# Patient Record
Sex: Female | Born: 1975
Health system: Southern US, Community
[De-identification: ages and names within clinical notes are randomized; demographics above are authoritative.]

## PROBLEM LIST (undated history)

## (undated) DIAGNOSIS — I10 Essential (primary) hypertension: Secondary | ICD-10-CM

## (undated) DIAGNOSIS — I8390 Asymptomatic varicose veins of unspecified lower extremity: Secondary | ICD-10-CM

## (undated) DIAGNOSIS — R55 Syncope and collapse: Secondary | ICD-10-CM

## (undated) HISTORY — DX: Essential (primary) hypertension: I10

## (undated) HISTORY — DX: Syncope and collapse: R55

## (undated) HISTORY — DX: Asymptomatic varicose veins of unspecified lower extremity: I83.90

---

## 2004-09-29 ENCOUNTER — Emergency Department (HOSPITAL_COMMUNITY): Admission: EM | Admit: 2004-09-29 | Discharge: 2004-09-29 | Payer: Self-pay | Admitting: Emergency Medicine

## 2005-09-03 ENCOUNTER — Inpatient Hospital Stay (HOSPITAL_COMMUNITY): Admission: AD | Admit: 2005-09-03 | Discharge: 2005-09-07 | Payer: Self-pay | Admitting: Obstetrics and Gynecology

## 2006-09-11 ENCOUNTER — Encounter (INDEPENDENT_AMBULATORY_CARE_PROVIDER_SITE_OTHER): Payer: Self-pay | Admitting: *Deleted

## 2006-09-11 ENCOUNTER — Ambulatory Visit: Payer: Self-pay | Admitting: Internal Medicine

## 2010-10-22 ENCOUNTER — Emergency Department (HOSPITAL_COMMUNITY): Admission: EM | Admit: 2010-10-22 | Discharge: 2010-10-22 | Payer: Self-pay | Admitting: Emergency Medicine

## 2011-05-17 NOTE — H&P (Signed)
Jennifer Wyatt               ACCOUNT NO.:  [id number]   MEDICAL RECORD NO.:  [id number]          PATIENT TYPE:  INP   LOCATION:  LDR2                          FACILITY:  [facility]   PHYSICIAN:  Tilda Burrow, M.D. DATE OF BIRTH:  May 30, 1976   DATE OF ADMISSION:  09/03/2005  DATE OF DISCHARGE:  LH                                HISTORY & PHYSICAL   Jennifer Wyatt's blood pressure on bedrest remains in the 150s/160s over high 90s.  Her protein on dip remained about 2+. Her 24-hour, however, was only 204 mg  per 24-hour period. Her swelling increased, so we just decided it would be  in her best interests to go ahead and deliver her.   PAST MEDICAL HISTORY:  She began prenatal care in her first trimester and  has had regular visits since then. Her prenatal labs include blood type O  positive, rubella immune. HBSAG, HIV, RPR, gonorrhea, chlamydia were all  negative. She was seropositive for HSV with no history of outbreaks. She has  been on suppression since August 08, 2005. Total weight gain has been about  45 pounds. She has had appropriate fundal height growth. Blood pressures  initially were 110s to 130s over 60s to 80s. The past few weeks, she has  slowly been creeping up until September 03, 2005 where she did have the  150/94 and started spilling 2+ protein where in the past she had been  negative to trace. DTRs about 2+. She does have 2 to 3+ pedal edema. Her  labs were all within normal limits.   PHYSICAL EXAMINATION:  HEENT:  Within normal limits.  HEART:  Regular rate and rhythm.  LUNGS:  Clear.  ABDOMEN:  Soft and nontender. No epigastric pain. Fundal height about 38 cm.  Estimated fetal weight around 7 to 8 pounds.  PELVIC:  Cervix was 1 cm, thick, -2 station. Vertex presentation.  EXTREMITIES:  Legs:  2 to 3+ edema. DTRs are 2+.   IMPRESSION:  Intrauterine pregnancy at 37 weeks, 5 days gestation. Pregnancy-  induced hypertension with proteinuria.   PLAN:  Dr. Emelda Fear will  place Cervidil to ripen the cervix and will plan  Pitocin and artificial rupture of membranes.     Jennifer Wyatt, C.N.M.      Tilda Burrow, M.D.  Electronically Signed   FC/MEDQ  D:  09/05/2005  T:  09/05/2005  Job:  [id number]

## 2011-05-17 NOTE — Op Note (Signed)
NAMESILVANNA, OHMER NO.:  [id number]   MEDICAL RECORD NO.:  [id number]          PATIENT TYPE:  INP   LOCATION:  LDR2                          FACILITY:  [facility]   PHYSICIAN:  Tilda Burrow, M.D. DATE OF BIRTH:  May 02, 1976   DATE OF PROCEDURE:  09/05/2005  DATE OF DISCHARGE:                                 OPERATIVE REPORT   DELIVERY NOTE:  Jareli sat up, got her epidural just enough to get a test  dose before she felt lots of pressure. She was laid back down, and the baby  was a +3 station. She was having some bradycardia. It came down to the 60s  which would return to baseline in between contractions. She did have O2  going. After about four contractions, she had a spontaneous vaginal delivery  of a viable female infant at 52. It delivered in a right occiput posterior  direction. There was no nuchal cord identified. Delivery was at 1447. The  mouth and nose were suctioned. Cord doubly clamped and cut. Weight is  pending though it looks to be around 7 pounds. Very little vernix and  peeling feet. Apgars are 8 and 9. Twenty units of Pitocin diluted in 1,000  cc of lactated ringers was infused rapidly IV. The placenta separated  spontaneously and was delivered via controlled cord traction at 1449. It was  inspected and appears to be intact with a three-vessel cord. Blood pressures  remained in the 150s to 160s over 80 region. Estimated blood loss 150 cc.  There was no catheter from the epidural to be removed.      Jacklyn Shell, C.N.M.      Tilda Burrow, M.D.  Electronically Signed    FC/MEDQ  D:  09/05/2005  T:  09/05/2005  Job:  [id number]

## 2011-05-17 NOTE — Group Therapy Note (Signed)
[name], [name]               ACCOUNT NO.:  [id number]   MEDICAL RECORD NO.:  [id number]          PATIENT TYPE:  OIB   LOCATION:  A414                          FACILITY:  [facility]   PHYSICIAN:  Lazaro Arms, M.D.   DATE OF BIRTH:  1976/06/05   DATE OF PROCEDURE:  DATE OF DISCHARGE:                                   PROGRESS NOTE   Jennifer Wyatt is 37 weeks 3 days gestation, and just came today for a regular  prenatal visit.  Her blood pressure was elevated to 150/94, with 2+  proteinuria.  She has had some slightly elevated blood pressure a few weeks  ago at 136/90, where she typically runs 110s-130/60s-80s.  She was admitted  for observation for evaluation and to rule out preeclampsia.  We are going  to start a 24-hour urine on her, keep an eye on her blood pressures, and  take it from there.      Jacklyn Shell, C.N.M.      Lazaro Arms, M.D.  Electronically Signed    FC/MEDQ  D:  09/03/2005  T:  09/03/2005  Job:  [id number]   cc:   [facility]

## 2011-05-17 NOTE — Op Note (Signed)
[name], Jennifer Wyatt             ACCOUNT NO.:  [id number]   MEDICAL RECORD NO.:  [id number]          PATIENT TYPE:  INP   LOCATION:  A413                          FACILITY:  [facility]   PHYSICIAN:  Tilda Burrow, M.D. DATE OF BIRTH:  Oct 22, 1976   DATE OF PROCEDURE:  09/05/2005  DATE OF DISCHARGE:  09/07/2005                                 OPERATIVE REPORT   EPIDURAL CATHETER NOTE:  Continuous lumbar epidural placed using loss-of-  resistance technique at L3-4 interspace with successful placement of the  catheter into the epidural space. We gave a 5-cc test dose of 1.5% lidocaine  with epinephrine followed by a 9-cc test dose of Marcaine 0.125% solution.  She was then placed on infusion on 12 cc per hour with good analgesic,  symmetric analgesic effect.      Tilda Burrow, M.D.  Electronically Signed     JVF/MEDQ  D:  09/19/2005  T:  09/19/2005  Job:  [id number]

## 2012-05-21 ENCOUNTER — Other Ambulatory Visit (HOSPITAL_COMMUNITY)
Admission: RE | Admit: 2012-05-21 | Discharge: 2012-05-21 | Disposition: A | Discharge status: Home / Self Care | Payer: Self-pay | Source: Ambulatory Visit | Attending: Obstetrics and Gynecology | Admitting: Obstetrics and Gynecology

## 2012-05-21 DIAGNOSIS — R87810 Cervical high risk human papillomavirus (HPV) DNA test positive: Secondary | ICD-10-CM | POA: Insufficient documentation

## 2012-05-21 DIAGNOSIS — Z113 Encounter for screening for infections with a predominantly sexual mode of transmission: Secondary | ICD-10-CM | POA: Insufficient documentation

## 2012-05-21 DIAGNOSIS — Z01419 Encounter for gynecological examination (general) (routine) without abnormal findings: Secondary | ICD-10-CM | POA: Insufficient documentation

## 2013-03-19 ENCOUNTER — Ambulatory Visit (INDEPENDENT_AMBULATORY_CARE_PROVIDER_SITE_OTHER): Payer: 59 | Admitting: Adult Health

## 2013-03-19 VITALS — BP 120/80 | Ht 64.0 in | Wt 172.0 lb

## 2013-03-19 DIAGNOSIS — Z3049 Encounter for surveillance of other contraceptives: Secondary | ICD-10-CM

## 2013-03-19 DIAGNOSIS — Z3202 Encounter for pregnancy test, result negative: Secondary | ICD-10-CM

## 2013-03-19 DIAGNOSIS — Z309 Encounter for contraceptive management, unspecified: Secondary | ICD-10-CM

## 2013-03-19 LAB — POCT URINE PREGNANCY: Preg Test, Ur: NEGATIVE

## 2013-03-19 MED ORDER — MEDROXYPROGESTERONE ACETATE 150 MG/ML IM SUSP
150.0000 mg | Freq: Once | INTRAMUSCULAR | Status: AC
  Administered 2013-03-19: 150 mg via INTRAMUSCULAR

## 2013-06-11 ENCOUNTER — Encounter: Payer: Self-pay | Admitting: Adult Health

## 2013-06-11 ENCOUNTER — Ambulatory Visit (INDEPENDENT_AMBULATORY_CARE_PROVIDER_SITE_OTHER): Payer: 59 | Admitting: Adult Health

## 2013-06-11 VITALS — BP 134/94 | Ht 64.0 in | Wt 177.0 lb

## 2013-06-11 DIAGNOSIS — Z3200 Encounter for pregnancy test, result unknown: Secondary | ICD-10-CM

## 2013-06-11 DIAGNOSIS — Z309 Encounter for contraceptive management, unspecified: Secondary | ICD-10-CM

## 2013-06-11 DIAGNOSIS — Z3049 Encounter for surveillance of other contraceptives: Secondary | ICD-10-CM

## 2013-06-11 DIAGNOSIS — Z3202 Encounter for pregnancy test, result negative: Secondary | ICD-10-CM

## 2013-06-11 LAB — POCT URINE PREGNANCY: Preg Test, Ur: NEGATIVE

## 2013-06-11 MED ORDER — MEDROXYPROGESTERONE ACETATE 150 MG/ML IM SUSP
150.0000 mg | Freq: Once | INTRAMUSCULAR | Status: AC
  Administered 2013-06-11: 150 mg via INTRAMUSCULAR

## 2013-08-31 ENCOUNTER — Other Ambulatory Visit: Payer: Self-pay | Admitting: Adult Health

## 2013-09-02 ENCOUNTER — Ambulatory Visit (INDEPENDENT_AMBULATORY_CARE_PROVIDER_SITE_OTHER): Payer: 59 | Admitting: Advanced Practice Midwife

## 2013-09-02 ENCOUNTER — Encounter: Payer: Self-pay | Admitting: Advanced Practice Midwife

## 2013-09-02 VITALS — BP 120/80 | Ht 64.0 in | Wt 178.0 lb

## 2013-09-02 DIAGNOSIS — Z309 Encounter for contraceptive management, unspecified: Secondary | ICD-10-CM

## 2013-09-02 DIAGNOSIS — Z3200 Encounter for pregnancy test, result unknown: Secondary | ICD-10-CM

## 2013-09-02 DIAGNOSIS — Z3049 Encounter for surveillance of other contraceptives: Secondary | ICD-10-CM

## 2013-09-02 DIAGNOSIS — Z3202 Encounter for pregnancy test, result negative: Secondary | ICD-10-CM

## 2013-09-02 MED ORDER — MEDROXYPROGESTERONE ACETATE 150 MG/ML IM SUSP
150.0000 mg | Freq: Once | INTRAMUSCULAR | Status: AC
  Administered 2013-09-02: 150 mg via INTRAMUSCULAR

## 2013-09-03 ENCOUNTER — Ambulatory Visit: Payer: 59

## 2013-11-27 ENCOUNTER — Other Ambulatory Visit: Payer: Self-pay | Admitting: Adult Health

## 2013-11-29 ENCOUNTER — Ambulatory Visit (INDEPENDENT_AMBULATORY_CARE_PROVIDER_SITE_OTHER): Payer: 59 | Admitting: Obstetrics & Gynecology

## 2013-11-29 ENCOUNTER — Encounter: Payer: Self-pay | Admitting: Obstetrics & Gynecology

## 2013-11-29 VITALS — BP 128/70 | Ht 63.0 in | Wt 180.8 lb

## 2013-11-29 DIAGNOSIS — Z3202 Encounter for pregnancy test, result negative: Secondary | ICD-10-CM

## 2013-11-29 DIAGNOSIS — IMO0001 Reserved for inherently not codable concepts without codable children: Secondary | ICD-10-CM

## 2013-11-29 DIAGNOSIS — Z3049 Encounter for surveillance of other contraceptives: Secondary | ICD-10-CM

## 2013-11-29 LAB — POCT URINE PREGNANCY: Preg Test, Ur: NEGATIVE

## 2013-11-29 MED ORDER — MEDROXYPROGESTERONE ACETATE 150 MG/ML IM SUSP
150.0000 mg | Freq: Once | INTRAMUSCULAR | Status: AC
  Administered 2013-11-29: 150 mg via INTRAMUSCULAR

## 2013-11-29 NOTE — Progress Notes (Signed)
Patient ID: Jennifer Wyatt, female   DOB: 1976/07/22, 37 y.o.   MRN: [id number] Pt here for depo provera 150mg , given with no complicaiton.  Pregnancy test resulted negative.

## 2014-02-21 ENCOUNTER — Ambulatory Visit (INDEPENDENT_AMBULATORY_CARE_PROVIDER_SITE_OTHER): Payer: 59 | Admitting: Adult Health

## 2014-02-21 ENCOUNTER — Encounter: Payer: Self-pay | Admitting: Adult Health

## 2014-02-21 VITALS — BP 120/80 | Ht 64.0 in | Wt 182.0 lb

## 2014-02-21 DIAGNOSIS — Z309 Encounter for contraceptive management, unspecified: Secondary | ICD-10-CM

## 2014-02-21 DIAGNOSIS — Z3200 Encounter for pregnancy test, result unknown: Secondary | ICD-10-CM

## 2014-02-21 DIAGNOSIS — Z3202 Encounter for pregnancy test, result negative: Secondary | ICD-10-CM

## 2014-02-21 DIAGNOSIS — Z3049 Encounter for surveillance of other contraceptives: Secondary | ICD-10-CM

## 2014-02-21 LAB — POCT URINE PREGNANCY: PREG TEST UR: NEGATIVE

## 2014-02-21 MED ORDER — MEDROXYPROGESTERONE ACETATE 150 MG/ML IM SUSP
150.0000 mg | Freq: Once | INTRAMUSCULAR | Status: AC
  Administered 2014-02-21: 150 mg via INTRAMUSCULAR

## 2014-03-13 ENCOUNTER — Encounter (HOSPITAL_COMMUNITY): Payer: Self-pay | Admitting: Emergency Medicine

## 2014-03-13 ENCOUNTER — Emergency Department (HOSPITAL_COMMUNITY): Payer: 59

## 2014-03-13 ENCOUNTER — Emergency Department (HOSPITAL_COMMUNITY)
Admission: EM | Admit: 2014-03-13 | Discharge: 2014-03-13 | Disposition: A | Discharge status: Home / Self Care | Payer: 59 | Attending: Emergency Medicine | Admitting: Emergency Medicine

## 2014-03-13 DIAGNOSIS — S61219A Laceration without foreign body of unspecified finger without damage to nail, initial encounter: Secondary | ICD-10-CM

## 2014-03-13 DIAGNOSIS — Y929 Unspecified place or not applicable: Secondary | ICD-10-CM | POA: Insufficient documentation

## 2014-03-13 DIAGNOSIS — Y939 Activity, unspecified: Secondary | ICD-10-CM | POA: Insufficient documentation

## 2014-03-13 DIAGNOSIS — S61209A Unspecified open wound of unspecified finger without damage to nail, initial encounter: Secondary | ICD-10-CM | POA: Insufficient documentation

## 2014-03-13 DIAGNOSIS — W268XXA Contact with other sharp object(s), not elsewhere classified, initial encounter: Secondary | ICD-10-CM | POA: Insufficient documentation

## 2014-03-13 DIAGNOSIS — Z23 Encounter for immunization: Secondary | ICD-10-CM | POA: Insufficient documentation

## 2014-03-13 MED ORDER — LIDOCAINE HCL (PF) 2 % IJ SOLN
INTRAMUSCULAR | Status: AC
  Administered 2014-03-13: 19:00:00
  Filled 2014-03-13: qty 10

## 2014-03-13 MED ORDER — HYDROCODONE-ACETAMINOPHEN 5-325 MG PO TABS: 2.0000 | ORAL_TABLET | ORAL | Status: DC | PRN

## 2014-03-13 MED ORDER — TETANUS-DIPHTH-ACELL PERTUSSIS 5-2.5-18.5 LF-MCG/0.5 IM SUSP
0.5000 mL | Freq: Once | INTRAMUSCULAR | Status: AC
  Administered 2014-03-13: 0.5 mL via INTRAMUSCULAR
  Filled 2014-03-13: qty 0.5

## 2014-03-13 NOTE — ED Notes (Signed)
Dressing applied to R thumb. Pt given instructions on suture and laceration care. Verbalized understanding.

## 2014-03-13 NOTE — Discharge Instructions (Signed)

## 2014-03-13 NOTE — ED Notes (Signed)
Laceration to right thumb from broken glass.

## 2014-03-13 NOTE — ED Provider Notes (Signed)
Medical screening examination/treatment/procedure(s) were performed by non-physician practitioner and as supervising physician I was immediately available for consultation/collaboration.  Joyia Riehle, MD 03/13/14 2354 

## 2014-03-13 NOTE — ED Provider Notes (Signed)
CSN: [id number]     Arrival date & time 03/13/14  1753 History   First MD Initiated Contact with Patient 03/13/14 1813     Chief Complaint  Patient presents with  . Laceration     (Consider location/radiation/quality/duration/timing/severity/associated sxs/prior Treatment) Patient is a 38 y.o. female presenting with skin laceration. The history is provided by the patient. No language interpreter was used.  Laceration Location:  Hand Length (cm):  2 Depth:  Through dermis Bleeding: controlled   Time since incident:  1 hour Laceration mechanism:  Broken glass Pain details:    Quality:  Aching   Severity:  Mild Foreign body present:  No foreign bodies Relieved by:  Nothing Tetanus status:  Out of date   History reviewed. No pertinent past medical history. History reviewed. No pertinent past surgical history. Family History  Problem Relation Age of Onset  . Thyroid disease Mother   . Diabetes Maternal Aunt   . Kidney failure Maternal Aunt    History  Substance Use Topics  . Smoking status: Never Smoker   . Smokeless tobacco: Never Used  . Alcohol Use: 0.0 oz/week     Comment: occ.   OB History   Grav Para Term Preterm Abortions TAB SAB Ect Mult Living   2 2        2      Review of Systems  Musculoskeletal: Positive for joint swelling.  All other systems reviewed and are negative.      Allergies  Review of patient's allergies indicates no known allergies.  Home Medications   Current Outpatient Rx  Name  Route  Sig  Dispense  Refill  . medroxyPROGESTERone (DEPO-PROVERA) 150 MG/ML injection      INJECT INTRAMUSCULARLY EVERY 11 WEEKS AT OFFICE   1 mL   4    BP 149/78  Pulse 99  Temp(Src) 98.7 F (37.1 C)  Resp 18  Ht 5\' 5"  (1.651 m)  Wt 182 lb (82.555 kg)  BMI 30.29 kg/m2  SpO2 98% Physical Exam  Nursing note and vitals reviewed. Constitutional: She is oriented to person, place, and time. She appears well-developed and well-nourished.   Musculoskeletal: She exhibits tenderness.  2cm laceration left thumb from  Ns and nv intact  Neurological: She is alert and oriented to person, place, and time. She has normal reflexes.  Skin: Skin is warm.  Psychiatric: She has a normal mood and affect.    ED Course  LACERATION REPAIR Date/Time: 03/13/2014 7:15 PM Performed by: Cheron SchaumannSOFIA, Gilberte Gorley K Authorized by: Cheron SchaumannSOFIA, Lyall Faciane K Risks and benefits: risks, benefits and alternatives were discussed Consent given by: patient Patient understanding: patient does not state understanding of the procedure being performed Patient identity confirmed: verbally with patient Body area: upper extremity Location details: right thumb Laceration length: 2 cm Foreign bodies: no foreign bodies Tendon involvement: none Nerve involvement: none Local anesthetic: lidocaine 2% without epinephrine Preparation: Patient was prepped and draped in the usual sterile fashion. Irrigation solution: saline Amount of cleaning: standard Debridement: none Skin closure: 5-0 Prolene Number of sutures: 6 Technique: simple Approximation: loose Approximation difficulty: simple Patient tolerance: Patient tolerated the procedure well with no immediate complications.   (including critical care time) Labs Review Labs Reviewed - No data to display Imaging Review No results found.   EKG Interpretation None      MDM   Final diagnoses:  Laceration of finger, right    Hydrocodone   Suture removal in 8 days    Elson AreasLeslie K Carylon Tamburro, PA-C 03/13/14  1917 

## 2014-05-16 ENCOUNTER — Encounter: Payer: Self-pay | Admitting: Adult Health

## 2014-05-16 ENCOUNTER — Ambulatory Visit (INDEPENDENT_AMBULATORY_CARE_PROVIDER_SITE_OTHER): Payer: 59 | Admitting: Adult Health

## 2014-05-16 VITALS — BP 128/88 | Ht 64.0 in | Wt 185.0 lb

## 2014-05-16 DIAGNOSIS — Z3049 Encounter for surveillance of other contraceptives: Secondary | ICD-10-CM

## 2014-05-16 DIAGNOSIS — Z3200 Encounter for pregnancy test, result unknown: Secondary | ICD-10-CM

## 2014-05-16 DIAGNOSIS — Z309 Encounter for contraceptive management, unspecified: Secondary | ICD-10-CM

## 2014-05-16 DIAGNOSIS — Z3202 Encounter for pregnancy test, result negative: Secondary | ICD-10-CM

## 2014-05-16 LAB — POCT URINE PREGNANCY: PREG TEST UR: NEGATIVE

## 2014-05-16 MED ORDER — MEDROXYPROGESTERONE ACETATE 150 MG/ML IM SUSP
150.0000 mg | Freq: Once | INTRAMUSCULAR | Status: AC
Start: 2014-05-16 — End: 2014-05-16
  Administered 2014-05-16: 150 mg via INTRAMUSCULAR

## 2014-08-08 ENCOUNTER — Ambulatory Visit (INDEPENDENT_AMBULATORY_CARE_PROVIDER_SITE_OTHER): Payer: BC Managed Care – PPO | Admitting: Adult Health

## 2014-08-08 ENCOUNTER — Encounter: Payer: Self-pay | Admitting: Adult Health

## 2014-08-08 DIAGNOSIS — Z3202 Encounter for pregnancy test, result negative: Secondary | ICD-10-CM

## 2014-08-08 DIAGNOSIS — Z3042 Encounter for surveillance of injectable contraceptive: Secondary | ICD-10-CM

## 2014-08-08 DIAGNOSIS — Z3049 Encounter for surveillance of other contraceptives: Secondary | ICD-10-CM

## 2014-08-08 LAB — POCT URINE PREGNANCY: PREG TEST UR: NEGATIVE

## 2014-08-08 MED ORDER — MEDROXYPROGESTERONE ACETATE 150 MG/ML IM SUSP
150.0000 mg | Freq: Once | INTRAMUSCULAR | Status: AC
  Administered 2014-08-08: 150 mg via INTRAMUSCULAR

## 2014-10-31 ENCOUNTER — Ambulatory Visit (INDEPENDENT_AMBULATORY_CARE_PROVIDER_SITE_OTHER): Payer: BC Managed Care – PPO | Admitting: Adult Health

## 2014-10-31 ENCOUNTER — Encounter: Payer: Self-pay | Admitting: Adult Health

## 2014-10-31 DIAGNOSIS — Z3042 Encounter for surveillance of injectable contraceptive: Secondary | ICD-10-CM

## 2014-10-31 DIAGNOSIS — Z3202 Encounter for pregnancy test, result negative: Secondary | ICD-10-CM

## 2014-10-31 LAB — POCT URINE PREGNANCY: Preg Test, Ur: NEGATIVE

## 2014-10-31 MED ORDER — MEDROXYPROGESTERONE ACETATE 150 MG/ML IM SUSP
150.0000 mg | Freq: Once | INTRAMUSCULAR | Status: AC
  Administered 2014-10-31: 150 mg via INTRAMUSCULAR

## 2015-01-19 ENCOUNTER — Other Ambulatory Visit: Payer: Self-pay | Admitting: Adult Health

## 2015-01-23 ENCOUNTER — Ambulatory Visit (INDEPENDENT_AMBULATORY_CARE_PROVIDER_SITE_OTHER): Payer: BLUE CROSS/BLUE SHIELD | Admitting: *Deleted

## 2015-01-23 DIAGNOSIS — Z3202 Encounter for pregnancy test, result negative: Secondary | ICD-10-CM

## 2015-01-23 DIAGNOSIS — Z3042 Encounter for surveillance of injectable contraceptive: Secondary | ICD-10-CM

## 2015-01-23 LAB — POCT URINE PREGNANCY: PREG TEST UR: NEGATIVE

## 2015-01-23 MED ORDER — MEDROXYPROGESTERONE ACETATE 150 MG/ML IM SUSP
150.0000 mg | Freq: Once | INTRAMUSCULAR | Status: AC
  Administered 2015-01-23: 150 mg via INTRAMUSCULAR

## 2015-01-23 NOTE — Progress Notes (Signed)
Pt given Depo Provera 150 mg IM injection, left hip, no complications, neg pregnancy test. Pt to return in 12 weeks for next injection.

## 2015-04-17 ENCOUNTER — Encounter: Payer: Self-pay | Admitting: *Deleted

## 2015-04-17 ENCOUNTER — Ambulatory Visit (INDEPENDENT_AMBULATORY_CARE_PROVIDER_SITE_OTHER): Payer: BLUE CROSS/BLUE SHIELD | Admitting: *Deleted

## 2015-04-17 DIAGNOSIS — Z3202 Encounter for pregnancy test, result negative: Secondary | ICD-10-CM

## 2015-04-17 DIAGNOSIS — Z3042 Encounter for surveillance of injectable contraceptive: Secondary | ICD-10-CM

## 2015-04-17 LAB — POCT URINE PREGNANCY: PREG TEST UR: NEGATIVE

## 2015-04-17 MED ORDER — MEDROXYPROGESTERONE ACETATE 150 MG/ML IM SUSP
150.0000 mg | Freq: Once | INTRAMUSCULAR | Status: AC
  Administered 2015-04-17: 150 mg via INTRAMUSCULAR

## 2015-04-17 NOTE — Progress Notes (Signed)
Pt here for Depo. Reports no problems at this time. Return in 12 weeks for next shot. JSY 

## 2015-07-10 ENCOUNTER — Ambulatory Visit (INDEPENDENT_AMBULATORY_CARE_PROVIDER_SITE_OTHER): Payer: BLUE CROSS/BLUE SHIELD | Admitting: *Deleted

## 2015-07-10 ENCOUNTER — Encounter: Payer: Self-pay | Admitting: *Deleted

## 2015-07-10 DIAGNOSIS — Z3202 Encounter for pregnancy test, result negative: Secondary | ICD-10-CM

## 2015-07-10 DIAGNOSIS — Z3042 Encounter for surveillance of injectable contraceptive: Secondary | ICD-10-CM | POA: Diagnosis not present

## 2015-07-10 LAB — POCT URINE PREGNANCY: PREG TEST UR: NEGATIVE

## 2015-07-10 MED ORDER — MEDROXYPROGESTERONE ACETATE 150 MG/ML IM SUSP
150.0000 mg | Freq: Once | INTRAMUSCULAR | Status: AC
  Administered 2015-07-10: 150 mg via INTRAMUSCULAR

## 2015-07-10 NOTE — Progress Notes (Signed)
Pt here for Depo. Reports no problems at this time. Return in 12 weeks for next shot. JSY 

## 2015-10-02 ENCOUNTER — Ambulatory Visit (INDEPENDENT_AMBULATORY_CARE_PROVIDER_SITE_OTHER): Payer: BLUE CROSS/BLUE SHIELD | Admitting: *Deleted

## 2015-10-02 ENCOUNTER — Encounter: Payer: Self-pay | Admitting: *Deleted

## 2015-10-02 DIAGNOSIS — Z3202 Encounter for pregnancy test, result negative: Secondary | ICD-10-CM | POA: Diagnosis not present

## 2015-10-02 DIAGNOSIS — Z3042 Encounter for surveillance of injectable contraceptive: Secondary | ICD-10-CM

## 2015-10-02 LAB — POCT URINE PREGNANCY: Preg Test, Ur: NEGATIVE

## 2015-10-02 MED ORDER — MEDROXYPROGESTERONE ACETATE 150 MG/ML IM SUSP
150.0000 mg | Freq: Once | INTRAMUSCULAR | Status: AC
  Administered 2015-10-02: 150 mg via INTRAMUSCULAR

## 2015-10-02 NOTE — Progress Notes (Signed)
Pt here for Depo. Reports no problems at this time. Return in 12 weeks for next shot. JSY 

## 2015-12-26 ENCOUNTER — Ambulatory Visit (INDEPENDENT_AMBULATORY_CARE_PROVIDER_SITE_OTHER): Payer: BLUE CROSS/BLUE SHIELD | Admitting: *Deleted

## 2015-12-26 ENCOUNTER — Ambulatory Visit: Payer: BLUE CROSS/BLUE SHIELD

## 2015-12-26 ENCOUNTER — Encounter: Payer: Self-pay | Admitting: *Deleted

## 2015-12-26 DIAGNOSIS — Z3202 Encounter for pregnancy test, result negative: Secondary | ICD-10-CM | POA: Diagnosis not present

## 2015-12-26 DIAGNOSIS — Z3042 Encounter for surveillance of injectable contraceptive: Secondary | ICD-10-CM

## 2015-12-26 LAB — POCT URINE PREGNANCY: PREG TEST UR: NEGATIVE

## 2015-12-26 MED ORDER — MEDROXYPROGESTERONE ACETATE 150 MG/ML IM SUSP
150.0000 mg | Freq: Once | INTRAMUSCULAR | Status: AC
  Administered 2015-12-26: 150 mg via INTRAMUSCULAR

## 2015-12-26 NOTE — Progress Notes (Signed)
Patient ID: Jennifer Wyatt, female   DOB: [birth year], 39 y.o.   MRN: [id number] Depo Provera 150 mg IM given left ventrogluteal with no complications, negative pregnancy test. Pt to return in 12 weeks for next injection.

## 2016-03-18 ENCOUNTER — Other Ambulatory Visit: Payer: Self-pay | Admitting: Adult Health

## 2016-03-19 ENCOUNTER — Ambulatory Visit (INDEPENDENT_AMBULATORY_CARE_PROVIDER_SITE_OTHER): Payer: BLUE CROSS/BLUE SHIELD | Admitting: *Deleted

## 2016-03-19 ENCOUNTER — Encounter: Payer: Self-pay | Admitting: *Deleted

## 2016-03-19 DIAGNOSIS — Z3202 Encounter for pregnancy test, result negative: Secondary | ICD-10-CM

## 2016-03-19 DIAGNOSIS — Z3042 Encounter for surveillance of injectable contraceptive: Secondary | ICD-10-CM | POA: Diagnosis not present

## 2016-03-19 LAB — POCT URINE PREGNANCY: PREG TEST UR: NEGATIVE

## 2016-03-19 MED ORDER — MEDROXYPROGESTERONE ACETATE 150 MG/ML IM SUSP
150.0000 mg | Freq: Once | INTRAMUSCULAR | Status: AC
  Administered 2016-03-19: 150 mg via INTRAMUSCULAR

## 2016-03-19 NOTE — Progress Notes (Signed)
Pt here for Depo. Pt tolerated shot well. Return in 12 weeks for next shot. JSY 

## 2016-06-11 ENCOUNTER — Ambulatory Visit (INDEPENDENT_AMBULATORY_CARE_PROVIDER_SITE_OTHER): Payer: BLUE CROSS/BLUE SHIELD | Admitting: *Deleted

## 2016-06-11 ENCOUNTER — Encounter: Payer: Self-pay | Admitting: *Deleted

## 2016-06-11 DIAGNOSIS — Z3202 Encounter for pregnancy test, result negative: Secondary | ICD-10-CM

## 2016-06-11 DIAGNOSIS — Z3042 Encounter for surveillance of injectable contraceptive: Secondary | ICD-10-CM

## 2016-06-11 LAB — POCT URINE PREGNANCY: PREG TEST UR: NEGATIVE

## 2016-06-11 MED ORDER — MEDROXYPROGESTERONE ACETATE 150 MG/ML IM SUSP
150.0000 mg | Freq: Once | INTRAMUSCULAR | Status: AC
  Administered 2016-06-11: 150 mg via INTRAMUSCULAR

## 2016-06-11 NOTE — Progress Notes (Signed)
Pt here for Depo. Pt tolerated shot well. Return in 12 weeks for next shot. JSY 

## 2016-09-03 ENCOUNTER — Encounter: Payer: Self-pay | Admitting: *Deleted

## 2016-09-03 ENCOUNTER — Ambulatory Visit (INDEPENDENT_AMBULATORY_CARE_PROVIDER_SITE_OTHER): Payer: BLUE CROSS/BLUE SHIELD | Admitting: *Deleted

## 2016-09-03 DIAGNOSIS — Z3202 Encounter for pregnancy test, result negative: Secondary | ICD-10-CM | POA: Diagnosis not present

## 2016-09-03 DIAGNOSIS — Z3042 Encounter for surveillance of injectable contraceptive: Secondary | ICD-10-CM | POA: Diagnosis not present

## 2016-09-03 LAB — POCT URINE PREGNANCY: PREG TEST UR: NEGATIVE

## 2016-09-03 MED ORDER — MEDROXYPROGESTERONE ACETATE 150 MG/ML IM SUSP
150.0000 mg | Freq: Once | INTRAMUSCULAR | Status: AC
  Administered 2016-09-03: 150 mg via INTRAMUSCULAR

## 2016-09-03 NOTE — Progress Notes (Signed)
Pt here for Depo. Pt tolerated shot well. Return in 12 weeks for next shot. JSY 

## 2016-12-03 ENCOUNTER — Encounter: Payer: Self-pay | Admitting: *Deleted

## 2016-12-03 ENCOUNTER — Ambulatory Visit (INDEPENDENT_AMBULATORY_CARE_PROVIDER_SITE_OTHER): Payer: BLUE CROSS/BLUE SHIELD | Admitting: *Deleted

## 2016-12-03 DIAGNOSIS — Z308 Encounter for other contraceptive management: Secondary | ICD-10-CM

## 2016-12-03 DIAGNOSIS — Z3042 Encounter for surveillance of injectable contraceptive: Secondary | ICD-10-CM

## 2016-12-03 DIAGNOSIS — Z3202 Encounter for pregnancy test, result negative: Secondary | ICD-10-CM

## 2016-12-03 LAB — POCT URINE PREGNANCY: PREG TEST UR: NEGATIVE

## 2016-12-03 MED ORDER — MEDROXYPROGESTERONE ACETATE 150 MG/ML IM SUSP
150.0000 mg | Freq: Once | INTRAMUSCULAR | Status: AC
  Administered 2016-12-03: 150 mg via INTRAMUSCULAR

## 2016-12-03 NOTE — Progress Notes (Signed)
Pt here for Depo. Pt tolerated shot well. Return in 12 weeks for next shot. JSY 

## 2017-02-25 ENCOUNTER — Encounter (INDEPENDENT_AMBULATORY_CARE_PROVIDER_SITE_OTHER): Payer: Self-pay

## 2017-02-25 ENCOUNTER — Encounter: Payer: Self-pay | Admitting: *Deleted

## 2017-02-25 ENCOUNTER — Ambulatory Visit (INDEPENDENT_AMBULATORY_CARE_PROVIDER_SITE_OTHER): Payer: BLUE CROSS/BLUE SHIELD | Admitting: *Deleted

## 2017-02-25 DIAGNOSIS — Z308 Encounter for other contraceptive management: Secondary | ICD-10-CM | POA: Diagnosis not present

## 2017-02-25 DIAGNOSIS — Z3202 Encounter for pregnancy test, result negative: Secondary | ICD-10-CM | POA: Diagnosis not present

## 2017-02-25 LAB — POCT URINE PREGNANCY: PREG TEST UR: NEGATIVE

## 2017-02-25 MED ORDER — MEDROXYPROGESTERONE ACETATE 150 MG/ML IM SUSP
150.0000 mg | Freq: Once | INTRAMUSCULAR | Status: AC
  Administered 2017-02-25: 150 mg via INTRAMUSCULAR

## 2017-02-25 NOTE — Progress Notes (Signed)
Pt here for Depo. Pt tolerated shot well. Return in 12 weeks for next shot. JSY 

## 2017-05-13 ENCOUNTER — Other Ambulatory Visit: Payer: Self-pay | Admitting: Adult Health

## 2017-05-20 ENCOUNTER — Ambulatory Visit (INDEPENDENT_AMBULATORY_CARE_PROVIDER_SITE_OTHER): Payer: BLUE CROSS/BLUE SHIELD

## 2017-05-20 VITALS — Wt 191.4 lb

## 2017-05-20 DIAGNOSIS — Z3042 Encounter for surveillance of injectable contraceptive: Secondary | ICD-10-CM | POA: Diagnosis not present

## 2017-05-20 DIAGNOSIS — Z3202 Encounter for pregnancy test, result negative: Secondary | ICD-10-CM | POA: Diagnosis not present

## 2017-05-20 LAB — POCT URINE PREGNANCY: Preg Test, Ur: NEGATIVE

## 2017-05-20 MED ORDER — MEDROXYPROGESTERONE ACETATE 150 MG/ML IM SUSP
150.0000 mg | Freq: Once | INTRAMUSCULAR | Status: AC
  Administered 2017-05-20: 150 mg via INTRAMUSCULAR

## 2017-05-20 NOTE — Progress Notes (Signed)
PT here for depo shot 150 mg IM given LT Ventroglutea. Tolerared well. Return 12 weeks for depo shot. Pad CMA

## 2017-08-12 ENCOUNTER — Encounter: Payer: Self-pay | Admitting: *Deleted

## 2017-08-12 ENCOUNTER — Ambulatory Visit (INDEPENDENT_AMBULATORY_CARE_PROVIDER_SITE_OTHER): Payer: BLUE CROSS/BLUE SHIELD | Admitting: *Deleted

## 2017-08-12 DIAGNOSIS — Z308 Encounter for other contraceptive management: Secondary | ICD-10-CM

## 2017-08-12 DIAGNOSIS — Z3202 Encounter for pregnancy test, result negative: Secondary | ICD-10-CM

## 2017-08-12 DIAGNOSIS — Z3042 Encounter for surveillance of injectable contraceptive: Secondary | ICD-10-CM | POA: Diagnosis not present

## 2017-08-12 LAB — POCT URINE PREGNANCY: PREG TEST UR: NEGATIVE

## 2017-08-12 MED ORDER — MEDROXYPROGESTERONE ACETATE 150 MG/ML IM SUSP
150.0000 mg | Freq: Once | INTRAMUSCULAR | Status: AC
  Administered 2017-08-12: 150 mg via INTRAMUSCULAR

## 2017-08-12 NOTE — Progress Notes (Signed)
Pt here for Depo. Pt tolerated shot well. Return in 12 weeks for next shot. JSY 

## 2017-10-28 ENCOUNTER — Other Ambulatory Visit: Payer: Self-pay | Admitting: Adult Health

## 2017-11-04 ENCOUNTER — Encounter: Payer: Self-pay | Admitting: *Deleted

## 2017-11-04 ENCOUNTER — Ambulatory Visit (INDEPENDENT_AMBULATORY_CARE_PROVIDER_SITE_OTHER): Payer: BLUE CROSS/BLUE SHIELD | Admitting: *Deleted

## 2017-11-04 DIAGNOSIS — Z3202 Encounter for pregnancy test, result negative: Secondary | ICD-10-CM | POA: Diagnosis not present

## 2017-11-04 DIAGNOSIS — Z3042 Encounter for surveillance of injectable contraceptive: Secondary | ICD-10-CM | POA: Diagnosis not present

## 2017-11-04 LAB — POCT URINE PREGNANCY: Preg Test, Ur: NEGATIVE

## 2017-11-04 MED ORDER — MEDROXYPROGESTERONE ACETATE 150 MG/ML IM SUSP
150.0000 mg | Freq: Once | INTRAMUSCULAR | Status: AC
  Administered 2017-11-04: 150 mg via INTRAMUSCULAR

## 2017-11-04 NOTE — Progress Notes (Signed)
Pt given DepoProvera 150mg IM left VG without complications. Advised pt to return in 12 weeks for next injection.  

## 2018-01-23 ENCOUNTER — Other Ambulatory Visit: Payer: Self-pay | Admitting: Adult Health

## 2018-01-27 ENCOUNTER — Ambulatory Visit (INDEPENDENT_AMBULATORY_CARE_PROVIDER_SITE_OTHER): Payer: BLUE CROSS/BLUE SHIELD

## 2018-01-27 ENCOUNTER — Encounter (INDEPENDENT_AMBULATORY_CARE_PROVIDER_SITE_OTHER): Payer: Self-pay

## 2018-01-27 VITALS — Wt 193.0 lb

## 2018-01-27 DIAGNOSIS — Z3202 Encounter for pregnancy test, result negative: Secondary | ICD-10-CM

## 2018-01-27 DIAGNOSIS — Z3049 Encounter for surveillance of other contraceptives: Secondary | ICD-10-CM

## 2018-01-27 DIAGNOSIS — Z3042 Encounter for surveillance of injectable contraceptive: Secondary | ICD-10-CM

## 2018-01-27 LAB — POCT URINE PREGNANCY: Preg Test, Ur: NEGATIVE

## 2018-01-27 MED ORDER — MEDROXYPROGESTERONE ACETATE 150 MG/ML IM SUSP
150.0000 mg | Freq: Once | INTRAMUSCULAR | Status: AC
  Administered 2018-01-27: 150 mg via INTRAMUSCULAR

## 2018-01-27 NOTE — Addendum Note (Signed)
Addended by: Federico FlakeNES, Saadiq Poche A on: 01/27/2018 09:48 AM   Modules accepted: Level of Service

## 2018-01-27 NOTE — Progress Notes (Signed)
Pt here for depo injection 150 mg IM given rt ventrogluteal. Tolerated well. Return 12 weeks for next injection. Pad CMA 

## 2018-04-21 ENCOUNTER — Encounter: Payer: Self-pay | Admitting: *Deleted

## 2018-04-21 ENCOUNTER — Ambulatory Visit (INDEPENDENT_AMBULATORY_CARE_PROVIDER_SITE_OTHER): Payer: BLUE CROSS/BLUE SHIELD | Admitting: *Deleted

## 2018-04-21 DIAGNOSIS — Z3042 Encounter for surveillance of injectable contraceptive: Secondary | ICD-10-CM

## 2018-04-21 DIAGNOSIS — Z3202 Encounter for pregnancy test, result negative: Secondary | ICD-10-CM

## 2018-04-21 DIAGNOSIS — Z308 Encounter for other contraceptive management: Secondary | ICD-10-CM

## 2018-04-21 LAB — POCT URINE PREGNANCY: PREG TEST UR: NEGATIVE

## 2018-04-21 MED ORDER — MEDROXYPROGESTERONE ACETATE 150 MG/ML IM SUSP
150.0000 mg | Freq: Once | INTRAMUSCULAR | Status: AC
  Administered 2018-04-21: 150 mg via INTRAMUSCULAR

## 2018-04-21 NOTE — Progress Notes (Signed)
Pt here for Depo. Pt received shot in left hip. Pt tolerated shot well. Return in 12 weeks for next shot. JSY 

## 2018-07-14 ENCOUNTER — Encounter: Payer: Self-pay | Admitting: *Deleted

## 2018-07-14 ENCOUNTER — Ambulatory Visit (INDEPENDENT_AMBULATORY_CARE_PROVIDER_SITE_OTHER): Payer: BLUE CROSS/BLUE SHIELD | Admitting: *Deleted

## 2018-07-14 DIAGNOSIS — Z3042 Encounter for surveillance of injectable contraceptive: Secondary | ICD-10-CM | POA: Diagnosis not present

## 2018-07-14 DIAGNOSIS — Z3202 Encounter for pregnancy test, result negative: Secondary | ICD-10-CM | POA: Diagnosis not present

## 2018-07-14 DIAGNOSIS — Z308 Encounter for other contraceptive management: Secondary | ICD-10-CM

## 2018-07-14 LAB — POCT URINE PREGNANCY: Preg Test, Ur: NEGATIVE

## 2018-07-14 MED ORDER — MEDROXYPROGESTERONE ACETATE 150 MG/ML IM SUSP
150.0000 mg | Freq: Once | INTRAMUSCULAR | Status: AC
  Administered 2018-07-14: 150 mg via INTRAMUSCULAR

## 2018-07-14 NOTE — Progress Notes (Signed)
Pt here for Depo. Pt received shot in right hip. Pt tolerated shot well. Return in 12 weeks for next shot. Pt is due for pap and physical. Advised to schedule appt. JSY

## 2018-08-18 ENCOUNTER — Encounter: Payer: Self-pay | Admitting: Adult Health

## 2018-08-18 ENCOUNTER — Other Ambulatory Visit (HOSPITAL_COMMUNITY)
Admission: RE | Admit: 2018-08-18 | Discharge: 2018-08-18 | Disposition: A | Discharge status: Home / Self Care | Payer: BLUE CROSS/BLUE SHIELD | Source: Ambulatory Visit | Attending: Adult Health | Admitting: Adult Health

## 2018-08-18 ENCOUNTER — Ambulatory Visit (INDEPENDENT_AMBULATORY_CARE_PROVIDER_SITE_OTHER): Payer: BLUE CROSS/BLUE SHIELD | Admitting: Adult Health

## 2018-08-18 VITALS — BP 170/100 | HR 66 | Ht 65.0 in | Wt 189.5 lb

## 2018-08-18 DIAGNOSIS — Z1211 Encounter for screening for malignant neoplasm of colon: Secondary | ICD-10-CM | POA: Insufficient documentation

## 2018-08-18 DIAGNOSIS — Z13220 Encounter for screening for lipoid disorders: Secondary | ICD-10-CM | POA: Diagnosis not present

## 2018-08-18 DIAGNOSIS — Z3042 Encounter for surveillance of injectable contraceptive: Secondary | ICD-10-CM

## 2018-08-18 DIAGNOSIS — R030 Elevated blood-pressure reading, without diagnosis of hypertension: Secondary | ICD-10-CM

## 2018-08-18 DIAGNOSIS — Z01419 Encounter for gynecological examination (general) (routine) without abnormal findings: Secondary | ICD-10-CM | POA: Insufficient documentation

## 2018-08-18 DIAGNOSIS — F32A Depression, unspecified: Secondary | ICD-10-CM | POA: Insufficient documentation

## 2018-08-18 DIAGNOSIS — F329 Major depressive disorder, single episode, unspecified: Secondary | ICD-10-CM | POA: Diagnosis not present

## 2018-08-18 DIAGNOSIS — Z1212 Encounter for screening for malignant neoplasm of rectum: Secondary | ICD-10-CM | POA: Diagnosis not present

## 2018-08-18 LAB — HEMOCCULT GUIAC POC 1CARD (OFFICE): FECAL OCCULT BLD: NEGATIVE

## 2018-08-18 MED ORDER — LISINOPRIL-HYDROCHLOROTHIAZIDE 10-12.5 MG PO TABS: 1.0000 | ORAL_TABLET | Freq: Every day | ORAL | 6 refills | Status: DC

## 2018-08-18 MED ORDER — MEDROXYPROGESTERONE ACETATE 150 MG/ML IM SUSP: INTRAMUSCULAR | 3 refills | Status: DC

## 2018-08-18 NOTE — Patient Instructions (Signed)
DASH Eating Plan DASH stands for "Dietary Approaches to Stop Hypertension." The DASH eating plan is a healthy eating plan that has been shown to reduce high blood pressure (hypertension). It may also reduce your risk for type 2 diabetes, heart disease, and stroke. The DASH eating plan may also help with weight loss. What are tips for following this plan? General guidelines  Avoid eating more than 2,300 mg (milligrams) of salt (sodium) a day. If you have hypertension, you may need to reduce your sodium intake to 1,500 mg a day.  Limit alcohol intake to no more than 1 drink a day for nonpregnant women and 2 drinks a day for men. One drink equals 12 oz of beer, 5 oz of wine, or 1 oz of hard liquor.  Work with your health care provider to maintain a healthy body weight or to lose weight. Ask what an ideal weight is for you.  Get at least 30 minutes of exercise that causes your heart to beat faster (aerobic exercise) most days of the week. Activities may include walking, swimming, or biking.  Work with your health care provider or diet and nutrition specialist (dietitian) to adjust your eating plan to your individual calorie needs. Reading food labels  Check food labels for the amount of sodium per serving. Choose foods with less than 5 percent of the Daily Value of sodium. Generally, foods with less than 300 mg of sodium per serving fit into this eating plan.  To find whole grains, look for the word "whole" as the first word in the ingredient list. Shopping  Buy products labeled as "low-sodium" or "no salt added."  Buy fresh foods. Avoid canned foods and premade or frozen meals. Cooking  Avoid adding salt when cooking. Use salt-free seasonings or herbs instead of table salt or sea salt. Check with your health care provider or pharmacist before using salt substitutes.  Do not fry foods. Cook foods using healthy methods such as baking, boiling, grilling, and broiling instead.  Cook with  heart-healthy oils, such as olive, canola, soybean, or sunflower oil. Meal planning   Eat a balanced diet that includes: ? 5 or more servings of fruits and vegetables each day. At each meal, try to fill half of your plate with fruits and vegetables. ? Up to 6-8 servings of whole grains each day. ? Less than 6 oz of lean meat, poultry, or fish each day. A 3-oz serving of meat is about the same size as a deck of cards. One egg equals 1 oz. ? 2 servings of low-fat dairy each day. ? A serving of nuts, seeds, or beans 5 times each week. ? Heart-healthy fats. Healthy fats called Omega-3 fatty acids are found in foods such as flaxseeds and coldwater fish, like sardines, salmon, and mackerel.  Limit how much you eat of the following: ? Canned or prepackaged foods. ? Food that is high in trans fat, such as fried foods. ? Food that is high in saturated fat, such as fatty meat. ? Sweets, desserts, sugary drinks, and other foods with added sugar. ? Full-fat dairy products.  Do not salt foods before eating.  Try to eat at least 2 vegetarian meals each week.  Eat more home-cooked food and less restaurant, buffet, and fast food.  When eating at a restaurant, ask that your food be prepared with less salt or no salt, if possible. What foods are recommended? The items listed may not be a complete list. Talk with your dietitian about what   dietary choices are best for you. Grains Whole-grain or whole-wheat bread. Whole-grain or whole-wheat pasta. Brown rice. Oatmeal. Quinoa. Bulgur. Whole-grain and low-sodium cereals. Pita bread. Low-fat, low-sodium crackers. Whole-wheat flour tortillas. Vegetables Fresh or frozen vegetables (raw, steamed, roasted, or grilled). Low-sodium or reduced-sodium tomato and vegetable juice. Low-sodium or reduced-sodium tomato sauce and tomato paste. Low-sodium or reduced-sodium canned vegetables. Fruits All fresh, dried, or frozen fruit. Canned fruit in natural juice (without  added sugar). Meat and other protein foods Skinless chicken or turkey. Ground chicken or turkey. Pork with fat trimmed off. Fish and seafood. Egg whites. Dried beans, peas, or lentils. Unsalted nuts, nut butters, and seeds. Unsalted canned beans. Lean cuts of beef with fat trimmed off. Low-sodium, lean deli meat. Dairy Low-fat (1%) or fat-free (skim) milk. Fat-free, low-fat, or reduced-fat cheeses. Nonfat, low-sodium ricotta or cottage cheese. Low-fat or nonfat yogurt. Low-fat, low-sodium cheese. Fats and oils Soft margarine without trans fats. Vegetable oil. Low-fat, reduced-fat, or light mayonnaise and salad dressings (reduced-sodium). Canola, safflower, olive, soybean, and sunflower oils. Avocado. Seasoning and other foods Herbs. Spices. Seasoning mixes without salt. Unsalted popcorn and pretzels. Fat-free sweets. What foods are not recommended? The items listed may not be a complete list. Talk with your dietitian about what dietary choices are best for you. Grains Baked goods made with fat, such as croissants, muffins, or some breads. Dry pasta or rice meal packs. Vegetables Creamed or fried vegetables. Vegetables in a cheese sauce. Regular canned vegetables (not low-sodium or reduced-sodium). Regular canned tomato sauce and paste (not low-sodium or reduced-sodium). Regular tomato and vegetable juice (not low-sodium or reduced-sodium). Pickles. Olives. Fruits Canned fruit in a light or heavy syrup. Fried fruit. Fruit in cream or butter sauce. Meat and other protein foods Fatty cuts of meat. Ribs. Fried meat. Bacon. Sausage. Bologna and other processed lunch meats. Salami. Fatback. Hotdogs. Bratwurst. Salted nuts and seeds. Canned beans with added salt. Canned or smoked fish. Whole eggs or egg yolks. Chicken or turkey with skin. Dairy Whole or 2% milk, cream, and half-and-half. Whole or full-fat cream cheese. Whole-fat or sweetened yogurt. Full-fat cheese. Nondairy creamers. Whipped toppings.  Processed cheese and cheese spreads. Fats and oils Butter. Stick margarine. Lard. Shortening. Ghee. Bacon fat. Tropical oils, such as coconut, palm kernel, or palm oil. Seasoning and other foods Salted popcorn and pretzels. Onion salt, garlic salt, seasoned salt, table salt, and sea salt. Worcestershire sauce. Tartar sauce. Barbecue sauce. Teriyaki sauce. Soy sauce, including reduced-sodium. Steak sauce. Canned and packaged gravies. Fish sauce. Oyster sauce. Cocktail sauce. Horseradish that you find on the shelf. Ketchup. Mustard. Meat flavorings and tenderizers. Bouillon cubes. Hot sauce and Tabasco sauce. Premade or packaged marinades. Premade or packaged taco seasonings. Relishes. Regular salad dressings. Where to find more information:  National Heart, Lung, and Blood Institute: www.nhlbi.nih.gov  American Heart Association: www.heart.org Summary  The DASH eating plan is a healthy eating plan that has been shown to reduce high blood pressure (hypertension). It may also reduce your risk for type 2 diabetes, heart disease, and stroke.  With the DASH eating plan, you should limit salt (sodium) intake to 2,300 mg a day. If you have hypertension, you may need to reduce your sodium intake to 1,500 mg a day.  When on the DASH eating plan, aim to eat more fresh fruits and vegetables, whole grains, lean proteins, low-fat dairy, and heart-healthy fats.  Work with your health care provider or diet and nutrition specialist (dietitian) to adjust your eating plan to your individual   calorie needs. This information is not intended to replace advice given to you by your health care provider. Make sure you discuss any questions you have with your health care provider. Document Released: 12/05/2011 Document Revised: 12/09/2016 Document Reviewed: 12/09/2016 Elsevier Interactive Patient Education  2018 Elsevier Inc.  

## 2018-08-18 NOTE — Progress Notes (Signed)
Patient ID: Jennifer Wyatt, female   DOB: [birth year], 42 y.o.   MRN: [id number] History of Present Illness: Jennifer Wyatt is a 42 year old black female,married, G2P2, on depo, in for well woman gyn exam and pap.She works third shift. No PCP.    Current Medications, Allergies, Past Medical History, Past Surgical History, Family History and Social History were reviewed in [facility] electronic medical record.     Review of Systems: Patient denies any headaches, hearing loss, blurred vision, shortness of breath, chest pain, abdominal pain, problems with bowel movements, urination, or intercourse. No joint pain or mood swings. She says she likes to sleep, feels tired and to avoid stuff.     Physical Exam:BP (!) 170/100 (BP Location: Left Arm, Cuff Size: Normal)   Pulse 66   Ht 5\' 5"  (1.651 m)   Wt 189 lb 8 oz (86 kg)   BMI 31.53 kg/m  General:  Well developed, well nourished, no acute distress Skin:  Warm and dry Neck:  Midline trachea, normal thyroid, good ROM, no lymphadenopathy Lungs; Clear to auscultation bilaterally Breast:  No dominant palpable mass, retraction, or nipple discharge Cardiovascular: Regular rate and rhythm Abdomen:  Soft, non tender, no hepatosplenomegaly Pelvic:  External genitalia is normal in appearance, no lesions.  The vagina is normal in appearance. Urethra has no lesions or masses. The cervix is bulbous, and smooth, pap with HPV and GC/CHL performed.  Uterus is felt to be normal size, shape, and contour.  No adnexal masses or tenderness noted.Bladder is non tender, no masses felt. Rectal: Good sphincter tone, no polyps, or hemorrhoids felt.  Hemoccult negative. Extremities/musculoskeletal:  No swelling or varicosities noted, no clubbing or cyanosis Psych:  No mood changes, alert and cooperative,seems happy PHQ 9 score 11, denies being suicidal. Declines meds. Examination chaperoned by Marylu LundJanet young LPN. Will rx BP meds, and advise DASH diet, will get labs  since fasting and F/U in 4 weeks on BP.   Impression: 1. Encounter for gynecological examination with Papanicolaou smear of cervix   2. Screening for colorectal cancer   3. Elevated BP without diagnosis of hypertension   4. Encounter for surveillance of injectable contraceptive   5. Depression, unspecified depression type       Plan: Check CBC,CMP,TSH and lipids Get mammogram now Meds ordered this encounter  Medications  . medroxyPROGESTERone (DEPO-PROVERA) 150 MG/ML injection    Sig: INJECT INTRAMUSCULARLY EVERY 11 WEEKS AT OFFICE    Dispense:  1 mL    Refill:  3    Order Specific Question:   Supervising Provider    Answer:   Despina HiddenEURE, LUTHER H [[id number]]  . lisinopril-hydrochlorothiazide (PRINZIDE,ZESTORETIC) 10-12.5 MG tablet    Sig: Take 1 tablet by mouth daily.    Dispense:  30 tablet    Refill:  6    Order Specific Question:   Supervising Provider    Answer:   Lazaro ArmsEURE, LUTHER H [[id number]]  Physical in 1 year Pap in 3 if normal Try DASH diet,review handout  Return in 4 weeks for BP check

## 2018-08-19 LAB — COMPREHENSIVE METABOLIC PANEL
A/G RATIO: 1.6 (ref 1.2–2.2)
ALT: 17 IU/L (ref 0–32)
AST: 27 IU/L (ref 0–40)
Albumin: 4.9 g/dL (ref 3.5–5.5)
Alkaline Phosphatase: 66 IU/L (ref 39–117)
BUN/Creatinine Ratio: 12 (ref 9–23)
BUN: 13 mg/dL (ref 6–24)
Bilirubin Total: 1.3 mg/dL — ABNORMAL HIGH (ref 0.0–1.2)
CHLORIDE: 103 mmol/L (ref 96–106)
CO2: 22 mmol/L (ref 20–29)
Calcium: 9.7 mg/dL (ref 8.7–10.2)
Creatinine, Ser: 1.07 mg/dL — ABNORMAL HIGH (ref 0.57–1.00)
GFR calc Af Amer: 75 mL/min/{1.73_m2} (ref >59)
GFR calc non Af Amer: 65 mL/min/{1.73_m2} (ref >59)
GLOBULIN, TOTAL: 3 g/dL (ref 1.5–4.5)
Glucose: 85 mg/dL (ref 65–99)
POTASSIUM: 3.9 mmol/L (ref 3.5–5.2)
SODIUM: 142 mmol/L (ref 134–144)
Total Protein: 7.9 g/dL (ref 6.0–8.5)

## 2018-08-19 LAB — CBC
Hematocrit: 39.6 % (ref 34.0–46.6)
Hemoglobin: 13.7 g/dL (ref 11.1–15.9)
MCH: 32.6 pg (ref 26.6–33.0)
MCHC: 34.6 g/dL (ref 31.5–35.7)
MCV: 94 fL (ref 79–97)
Platelets: 208 10*3/uL (ref 150–450)
RBC: 4.2 x10E6/uL (ref 3.77–5.28)
RDW: 13.2 % (ref 12.3–15.4)
WBC: 10.1 10*3/uL (ref 3.4–10.8)

## 2018-08-19 LAB — LIPID PANEL
CHOLESTEROL TOTAL: 159 mg/dL (ref 100–199)
Chol/HDL Ratio: 3.2 ratio (ref 0.0–4.4)
HDL: 50 mg/dL (ref >39)
LDL Calculated: 92 mg/dL (ref 0–99)
Triglycerides: 83 mg/dL (ref 0–149)
VLDL Cholesterol Cal: 17 mg/dL (ref 5–40)

## 2018-08-19 LAB — TSH: TSH: 2.57 u[IU]/mL (ref 0.450–4.500)

## 2018-08-20 ENCOUNTER — Telehealth: Payer: Self-pay | Admitting: Adult Health

## 2018-08-20 LAB — CYTOLOGY - PAP
Chlamydia: NEGATIVE
DIAGNOSIS: NEGATIVE
HPV (WINDOPATH): NOT DETECTED
NEISSERIA GONORRHEA: NEGATIVE

## 2018-08-20 NOTE — Telephone Encounter (Signed)
Left message with lab results,and that they looked good, and that pap was negative for malignancy and HPV and GC/CHL.Creatnine slightly elevated.

## 2018-09-16 ENCOUNTER — Ambulatory Visit (INDEPENDENT_AMBULATORY_CARE_PROVIDER_SITE_OTHER): Payer: BLUE CROSS/BLUE SHIELD | Admitting: Adult Health

## 2018-09-16 ENCOUNTER — Encounter: Payer: Self-pay | Admitting: Adult Health

## 2018-09-16 VITALS — BP 156/94 | HR 86 | Ht 65.0 in | Wt 186.0 lb

## 2018-09-16 DIAGNOSIS — I10 Essential (primary) hypertension: Secondary | ICD-10-CM | POA: Diagnosis not present

## 2018-09-16 MED ORDER — LISINOPRIL-HYDROCHLOROTHIAZIDE 20-25 MG PO TABS: 1.0000 | ORAL_TABLET | Freq: Every day | ORAL | 2 refills | Status: DC

## 2018-09-16 NOTE — Progress Notes (Signed)
  Subjective:     Patient ID: Soundra Pilonandice G Bergfeld, female   DOB: 1976/06/24, 42 y.o.   MRN: [id number]  HPI Williemae NatterCandice is a 42 year old black female in for BP check, works third shift has not had meds for today.  Review of Systems Has some nausea at times, but had before BP meds Some constipation Reviewed past medical,surgical, social and family history. Reviewed medications and allergies.     Objective:   Physical Exam BP (!) 156/94 (BP Location: Left Arm, Patient Position: Sitting, Cuff Size: Normal)   Pulse 86   Ht 5\' 5"  (1.651 m)   Wt 186 lb (84.4 kg)   BMI 30.95 kg/m   Skin warm and dry. Lungs: clear to ausculation bilaterally. Cardiovascular: regular rate and rhythm. Has lost 3 lbs  Continue DASH diet and will increase BP meds. Increase water.  Assessment:     1. Essential hypertension       Plan:     Meds ordered this encounter  Medications  . lisinopril-hydrochlorothiazide (PRINZIDE,ZESTORETIC) 20-25 MG tablet    Sig: Take 1 tablet by mouth daily.    Dispense:  30 tablet    Refill:  2    Order Specific Question:   Supervising Provider    Answer:   Despina HiddenEURE, LUTHER H [[id number]]  F/U in 4 weeks for BP check

## 2018-10-06 ENCOUNTER — Ambulatory Visit (INDEPENDENT_AMBULATORY_CARE_PROVIDER_SITE_OTHER): Payer: BLUE CROSS/BLUE SHIELD

## 2018-10-06 VITALS — Ht 65.0 in | Wt 184.8 lb

## 2018-10-06 DIAGNOSIS — Z3042 Encounter for surveillance of injectable contraceptive: Secondary | ICD-10-CM

## 2018-10-06 DIAGNOSIS — Z3202 Encounter for pregnancy test, result negative: Secondary | ICD-10-CM

## 2018-10-06 LAB — POCT URINE PREGNANCY: PREG TEST UR: NEGATIVE

## 2018-10-06 MED ORDER — MEDROXYPROGESTERONE ACETATE 150 MG/ML IM SUSP
150.0000 mg | Freq: Once | INTRAMUSCULAR | Status: AC
  Administered 2018-10-06: 150 mg via INTRAMUSCULAR

## 2018-10-06 NOTE — Progress Notes (Signed)
Pt here for depo injectable 150 mg IM given lt VG. Tolerated well. Return 12 weeks for next injection. Pad CMA

## 2018-10-14 ENCOUNTER — Encounter: Payer: Self-pay | Admitting: Adult Health

## 2018-10-14 ENCOUNTER — Ambulatory Visit (INDEPENDENT_AMBULATORY_CARE_PROVIDER_SITE_OTHER): Payer: BLUE CROSS/BLUE SHIELD | Admitting: Adult Health

## 2018-10-14 VITALS — BP 137/88 | HR 82 | Ht 64.0 in | Wt 189.0 lb

## 2018-10-14 DIAGNOSIS — I10 Essential (primary) hypertension: Secondary | ICD-10-CM | POA: Diagnosis not present

## 2018-10-14 DIAGNOSIS — R7989 Other specified abnormal findings of blood chemistry: Secondary | ICD-10-CM | POA: Diagnosis not present

## 2018-10-14 MED ORDER — LISINOPRIL-HYDROCHLOROTHIAZIDE 20-25 MG PO TABS: 1.0000 | ORAL_TABLET | Freq: Every day | ORAL | 12 refills | Status: DC

## 2018-10-14 NOTE — Progress Notes (Signed)
  Subjective:     Patient ID: [name], female   DOB: 1976/11/02, 42 y.o.   MRN: [id number]  HPI Yeira is a 42 year old black female in for BP check.  Review of Systems She said she felt dizzy  And fainted in September, but had eaten only cheese and wings at the time, was sitting on bed and got up to go to bathroom when it happed, no episodes since  Reviewed past medical,surgical, social and family history. Reviewed medications and allergies.     Objective:   Physical Exam BP 137/88 (BP Location: Left Arm, Patient Position: Sitting, Cuff Size: Normal)   Pulse 82   Ht 5\' 4"  (1.626 m)   Wt 189 lb (85.7 kg)   BMI 32.44 kg/m  Skin warm and dry. Lungs: clear to ausculation bilaterally. Cardiovascular: regular rate and rhythm. BP is better will continue meds and will check CMP, as creatinine was slightly elevated in August.     Assessment:     1. Essential hypertension   2. Elevated serum creatinine       Plan:     Meds ordered this encounter  Medications  . lisinopril-hydrochlorothiazide (PRINZIDE,ZESTORETIC) 20-25 MG tablet    Sig: Take 1 tablet by mouth daily.    Dispense:  30 tablet    Refill:  12    Order Specific Question:   Supervising Provider    Answer:   Duane Lope H [[id number]]  Check CMP Watch salt and sugars F/U in 3 months for BP check

## 2018-10-15 DIAGNOSIS — R7989 Other specified abnormal findings of blood chemistry: Secondary | ICD-10-CM | POA: Diagnosis not present

## 2018-10-16 LAB — COMPREHENSIVE METABOLIC PANEL
A/G RATIO: 1.9 (ref 1.2–2.2)
ALT: 21 IU/L (ref 0–32)
AST: 21 IU/L (ref 0–40)
Albumin: 5 g/dL (ref 3.5–5.5)
Alkaline Phosphatase: 69 IU/L (ref 39–117)
BUN/Creatinine Ratio: 15 (ref 9–23)
BUN: 16 mg/dL (ref 6–24)
Bilirubin Total: 0.6 mg/dL (ref 0.0–1.2)
CALCIUM: 9.8 mg/dL (ref 8.7–10.2)
CO2: 23 mmol/L (ref 20–29)
Chloride: 94 mmol/L — ABNORMAL LOW (ref 96–106)
Creatinine, Ser: 1.1 mg/dL — ABNORMAL HIGH (ref 0.57–1.00)
GFR calc Af Amer: 72 mL/min/{1.73_m2} (ref >59)
GFR, EST NON AFRICAN AMERICAN: 63 mL/min/{1.73_m2} (ref >59)
Globulin, Total: 2.7 g/dL (ref 1.5–4.5)
Glucose: 90 mg/dL (ref 65–99)
POTASSIUM: 3.9 mmol/L (ref 3.5–5.2)
Sodium: 135 mmol/L (ref 134–144)
Total Protein: 7.7 g/dL (ref 6.0–8.5)

## 2018-10-20 ENCOUNTER — Telehealth: Payer: Self-pay | Admitting: Adult Health

## 2018-10-20 NOTE — Telephone Encounter (Signed)
Left message that creatinine has gone up, will refer to Dr Kristian Covey to evaluate, his office will send her a letter

## 2018-11-23 DIAGNOSIS — M948X9 Other specified disorders of cartilage, unspecified sites: Secondary | ICD-10-CM | POA: Diagnosis not present

## 2018-12-29 ENCOUNTER — Encounter: Payer: Self-pay | Admitting: *Deleted

## 2018-12-29 ENCOUNTER — Ambulatory Visit (INDEPENDENT_AMBULATORY_CARE_PROVIDER_SITE_OTHER): Payer: BLUE CROSS/BLUE SHIELD | Admitting: *Deleted

## 2018-12-29 ENCOUNTER — Encounter (INDEPENDENT_AMBULATORY_CARE_PROVIDER_SITE_OTHER): Payer: Self-pay

## 2018-12-29 DIAGNOSIS — E559 Vitamin D deficiency, unspecified: Secondary | ICD-10-CM | POA: Diagnosis not present

## 2018-12-29 DIAGNOSIS — R809 Proteinuria, unspecified: Secondary | ICD-10-CM | POA: Diagnosis not present

## 2018-12-29 DIAGNOSIS — Z3042 Encounter for surveillance of injectable contraceptive: Secondary | ICD-10-CM | POA: Diagnosis not present

## 2018-12-29 DIAGNOSIS — Z3202 Encounter for pregnancy test, result negative: Secondary | ICD-10-CM | POA: Diagnosis not present

## 2018-12-29 DIAGNOSIS — I10 Essential (primary) hypertension: Secondary | ICD-10-CM | POA: Diagnosis not present

## 2018-12-29 DIAGNOSIS — Z308 Encounter for other contraceptive management: Secondary | ICD-10-CM

## 2018-12-29 DIAGNOSIS — Z79899 Other long term (current) drug therapy: Secondary | ICD-10-CM | POA: Diagnosis not present

## 2018-12-29 LAB — POCT URINE PREGNANCY: PREG TEST UR: NEGATIVE

## 2018-12-29 MED ORDER — MEDROXYPROGESTERONE ACETATE 150 MG/ML IM SUSP
150.0000 mg | Freq: Once | INTRAMUSCULAR | Status: AC
  Administered 2018-12-29: 150 mg via INTRAMUSCULAR

## 2018-12-29 NOTE — Progress Notes (Signed)
Pt here for Depo. Pt received shot in right hip. Pt tolerated shot well. Return in 12 weeks for next shot. JSY 

## 2019-01-04 ENCOUNTER — Other Ambulatory Visit (HOSPITAL_COMMUNITY): Payer: Self-pay | Admitting: Medical

## 2019-01-04 DIAGNOSIS — N183 Chronic kidney disease, stage 3 unspecified: Secondary | ICD-10-CM

## 2019-01-07 ENCOUNTER — Ambulatory Visit (HOSPITAL_COMMUNITY): Payer: BLUE CROSS/BLUE SHIELD

## 2019-01-09 ENCOUNTER — Other Ambulatory Visit: Payer: Self-pay | Admitting: Adult Health

## 2019-01-11 ENCOUNTER — Ambulatory Visit (HOSPITAL_COMMUNITY)
Admission: RE | Admit: 2019-01-11 | Discharge: 2019-01-11 | Disposition: A | Discharge status: Home / Self Care | Payer: BLUE CROSS/BLUE SHIELD | Source: Ambulatory Visit | Attending: Medical | Admitting: Medical

## 2019-01-11 DIAGNOSIS — N183 Chronic kidney disease, stage 3 unspecified: Secondary | ICD-10-CM

## 2019-01-11 DIAGNOSIS — N189 Chronic kidney disease, unspecified: Secondary | ICD-10-CM | POA: Diagnosis not present

## 2019-01-14 ENCOUNTER — Encounter: Payer: Self-pay | Admitting: Adult Health

## 2019-01-14 ENCOUNTER — Ambulatory Visit (INDEPENDENT_AMBULATORY_CARE_PROVIDER_SITE_OTHER): Payer: BLUE CROSS/BLUE SHIELD | Admitting: Adult Health

## 2019-01-14 VITALS — BP 139/96 | HR 113 | Ht 64.0 in | Wt 188.0 lb

## 2019-01-14 DIAGNOSIS — R7989 Other specified abnormal findings of blood chemistry: Secondary | ICD-10-CM | POA: Diagnosis not present

## 2019-01-14 DIAGNOSIS — I10 Essential (primary) hypertension: Secondary | ICD-10-CM | POA: Diagnosis not present

## 2019-01-14 NOTE — Progress Notes (Signed)
Patient ID: Jennifer Wyatt, female   DOB: Feb 09, 1976, 43 y.o.   MRN: [id number] History of Present Illness: Jennifer Wyatt is a 43 year old black female back in follow up on BP, and she had referral to Dr Kristian CoveyBefekadu, and saw him in December and did 24 hour urine collection and had renal US 01/11/2019, and will see him in follow up today.    Current Medications, Allergies, Past Medical History, Past Surgical History, Family History and Social History were reviewed in [facility] electronic medical record.     Review of Systems: No unusual headaches    Physical Exam:BP (!) 139/96 (BP Location: Right Arm, Patient Position: Sitting, Cuff Size: Large)   Pulse (!) 113   Ht 5\' 4"  (1.626 m)   Wt 188 lb (85.3 kg)   BMI 32.27 kg/m  General:  Well developed, well nourished, no acute distress Skin:  Warm and dry Lungs; Clear to auscultation bilaterally Cardiovascular: Regular rate and rhythm Psych:  No mood changes, alert and cooperative,seems happy The renal US showed right renal cyst, report is in [facility].   Impression: 1. Essential hypertension   2. Elevated serum creatinine       Plan: Continue meds for now, but discuss with Dr Kristian CoveyBefekadu today at visit, he may want to add to meds or even change, depending on 24 hour urine results  F/U in 3 months

## 2019-03-23 ENCOUNTER — Other Ambulatory Visit: Payer: Self-pay

## 2019-03-23 ENCOUNTER — Ambulatory Visit (INDEPENDENT_AMBULATORY_CARE_PROVIDER_SITE_OTHER): Payer: BLUE CROSS/BLUE SHIELD | Admitting: *Deleted

## 2019-03-23 DIAGNOSIS — Z3202 Encounter for pregnancy test, result negative: Secondary | ICD-10-CM | POA: Diagnosis not present

## 2019-03-23 DIAGNOSIS — Z3042 Encounter for surveillance of injectable contraceptive: Secondary | ICD-10-CM | POA: Diagnosis not present

## 2019-03-23 LAB — POCT URINE PREGNANCY: Preg Test, Ur: NEGATIVE

## 2019-03-23 MED ORDER — MEDROXYPROGESTERONE ACETATE 150 MG/ML IM SUSP
150.0000 mg | Freq: Once | INTRAMUSCULAR | Status: AC
  Administered 2019-03-23: 150 mg via INTRAMUSCULAR

## 2019-03-23 NOTE — Progress Notes (Signed)
Depo Provera 150 mg given IM in left upper outer gluteal. Patient tolerated well. Next dose in 12 weeks.  

## 2019-04-15 ENCOUNTER — Telehealth: Payer: Self-pay | Admitting: *Deleted

## 2019-04-15 NOTE — Telephone Encounter (Signed)
Patient informed that we are not allowing visitors or children to come to appointments at this time. Patient denies any contact with anyone suspected or confirmed of having COVID-19. Pt denies fever, cough, sob, muscle pain, diarrhea, Jennifer Wyatt, vomiting, abdominal pain, red eye, weakness, bruising or bleeding, joint pain or severe headache.  

## 2019-04-16 ENCOUNTER — Ambulatory Visit (INDEPENDENT_AMBULATORY_CARE_PROVIDER_SITE_OTHER): Payer: BLUE CROSS/BLUE SHIELD | Admitting: Adult Health

## 2019-04-16 ENCOUNTER — Other Ambulatory Visit: Payer: Self-pay

## 2019-04-16 ENCOUNTER — Encounter: Payer: Self-pay | Admitting: Adult Health

## 2019-04-16 VITALS — BP 120/76 | HR 109 | Temp 98.3°F | Ht 64.0 in | Wt 192.0 lb

## 2019-04-16 DIAGNOSIS — I10 Essential (primary) hypertension: Secondary | ICD-10-CM

## 2019-04-16 MED ORDER — LISINOPRIL-HYDROCHLOROTHIAZIDE 20-25 MG PO TABS: 1.0000 | ORAL_TABLET | Freq: Every day | ORAL | 4 refills | Status: DC

## 2019-04-16 NOTE — Progress Notes (Signed)
Patient ID: Jennifer Wyatt, female   DOB: 08/07/1976, 43 y.o.   MRN: [id number] History of Present Illness: Jennifer Wyatt is a 43 year old black female in for a BP check. Her BP is much better,she says it is because she is home right now, was laid off at [employer]. She saw Dr Kristian Covey in January and has F/U with him in July.   Current Medications, Allergies, Past Medical History, Past Surgical History, Family History and Social History were reviewed in [employer] record.     Review of Systems: Patient denies any headaches, hearing loss, fatigue, blurred vision, shortness of breath,cough, chest pain, abdominal pain, problems with bowel movements, urination, or intercourse. No joint pain or mood swings.    Physical Exam:BP 120/76 (BP Location: Left Arm, Patient Position: Sitting, Cuff Size: Large)   Pulse (!) 109   Temp 98.3 F (36.8 C)   Ht 5\' 4"  (1.626 m)   Wt 192 lb (87.1 kg)   BMI 32.96 kg/m  General:  Well developed, well nourished, no acute distress Skin:  Warm and dry Lungs; Clear to auscultation bilaterally Cardiovascular: Regular rate and rhythm Extremities/musculoskeletal:  No swelling Psych:  No mood changes, alert and cooperative,seems happy   Impression:  1. Essential hypertension Meds ordered this encounter  Medications  . lisinopril-hydrochlorothiazide (ZESTORETIC) 20-25 MG tablet    Sig: Take 1 tablet by mouth daily.    Dispense:  90 tablet    Refill:  4    Order Specific Question:   Supervising Provider    Answer:   Lazaro Arms [[id number]]     Plan: Continue zestoretic  Return in 4 months for physical   Will need mammogram this year

## 2019-06-15 ENCOUNTER — Other Ambulatory Visit: Payer: Self-pay

## 2019-06-15 ENCOUNTER — Ambulatory Visit (INDEPENDENT_AMBULATORY_CARE_PROVIDER_SITE_OTHER): Payer: BC Managed Care – PPO | Admitting: *Deleted

## 2019-06-15 DIAGNOSIS — Z3042 Encounter for surveillance of injectable contraceptive: Secondary | ICD-10-CM | POA: Diagnosis not present

## 2019-06-15 MED ORDER — MEDROXYPROGESTERONE ACETATE 150 MG/ML IM SUSP
150.0000 mg | Freq: Once | INTRAMUSCULAR | Status: AC
  Administered 2019-06-15: 150 mg via INTRAMUSCULAR

## 2019-06-15 NOTE — Progress Notes (Signed)
Depo Provera 150mg  IM given in left gluteal with no complications. Pt to return in 12 weeks for next injection.

## 2019-08-19 ENCOUNTER — Other Ambulatory Visit: Payer: BLUE CROSS/BLUE SHIELD | Admitting: Adult Health

## 2019-09-02 ENCOUNTER — Other Ambulatory Visit: Payer: Self-pay | Admitting: Adult Health

## 2019-09-03 ENCOUNTER — Telehealth: Payer: Self-pay | Admitting: Obstetrics & Gynecology

## 2019-09-03 NOTE — Telephone Encounter (Signed)
Called patient regarding appointment scheduled in our office encouraged to come alone to the visit if possible, however, a support person, over age 42, may accompany her  to appointment if assistance is needed for safety or care concerns. Otherwise, support persons should remain outside until the visit is complete.  ° °We ask if you have had any exposure to anyone suspected or confirmed of having COVID-19 or if you are experiencing any of the following, to call and reschedule your appointment: fever, cough, shortness of breath, muscle pain, diarrhea, rash, vomiting, abdominal pain, red eye, weakness, bruising, bleeding, joint pain, or a severe headache.  ° °Please know we will ask you these questions or similar questions when you arrive for your appointment and again it’s how we are keeping everyone safe.   ° °Also,to keep you safe, please use the provided hand sanitizer when you enter the office. We are asking everyone in the office to wear a mask to help prevent the spread of °germs. If you have a mask of your own, please wear it to your appointment, if not, we are happy to provide one for you. ° °Thank you for understanding and your cooperation.  ° ° °[facility] Staff ° ° ° °

## 2019-09-07 ENCOUNTER — Other Ambulatory Visit: Payer: Self-pay

## 2019-09-07 ENCOUNTER — Ambulatory Visit (INDEPENDENT_AMBULATORY_CARE_PROVIDER_SITE_OTHER): Payer: BC Managed Care – PPO | Admitting: *Deleted

## 2019-09-07 ENCOUNTER — Encounter: Payer: Self-pay | Admitting: *Deleted

## 2019-09-07 VITALS — Ht 67.0 in

## 2019-09-07 DIAGNOSIS — Z3042 Encounter for surveillance of injectable contraceptive: Secondary | ICD-10-CM | POA: Diagnosis not present

## 2019-09-07 DIAGNOSIS — Z3202 Encounter for pregnancy test, result negative: Secondary | ICD-10-CM

## 2019-09-07 DIAGNOSIS — Z308 Encounter for other contraceptive management: Secondary | ICD-10-CM

## 2019-09-07 LAB — POCT URINE PREGNANCY: Preg Test, Ur: NEGATIVE

## 2019-09-07 MED ORDER — MEDROXYPROGESTERONE ACETATE 150 MG/ML IM SUSP
150.0000 mg | Freq: Once | INTRAMUSCULAR | Status: AC
  Administered 2019-09-07: 150 mg via INTRAMUSCULAR

## 2019-09-07 NOTE — Progress Notes (Signed)
Pt here for Depo. Pt received shot in right hip. Pt tolerated shot well. Return in 12 weeks for next shot. JSY 

## 2019-10-25 ENCOUNTER — Encounter: Payer: Self-pay | Admitting: Adult Health

## 2019-10-25 ENCOUNTER — Ambulatory Visit (INDEPENDENT_AMBULATORY_CARE_PROVIDER_SITE_OTHER): Payer: BC Managed Care – PPO | Admitting: Adult Health

## 2019-10-25 ENCOUNTER — Other Ambulatory Visit: Payer: Self-pay

## 2019-10-25 VITALS — BP 129/86 | HR 85 | Ht 65.0 in | Wt 200.5 lb

## 2019-10-25 DIAGNOSIS — R7989 Other specified abnormal findings of blood chemistry: Secondary | ICD-10-CM

## 2019-10-25 DIAGNOSIS — Z1211 Encounter for screening for malignant neoplasm of colon: Secondary | ICD-10-CM | POA: Diagnosis not present

## 2019-10-25 DIAGNOSIS — Z1212 Encounter for screening for malignant neoplasm of rectum: Secondary | ICD-10-CM | POA: Diagnosis not present

## 2019-10-25 DIAGNOSIS — I10 Essential (primary) hypertension: Secondary | ICD-10-CM

## 2019-10-25 DIAGNOSIS — Z01419 Encounter for gynecological examination (general) (routine) without abnormal findings: Secondary | ICD-10-CM | POA: Insufficient documentation

## 2019-10-25 DIAGNOSIS — N9489 Other specified conditions associated with female genital organs and menstrual cycle: Secondary | ICD-10-CM | POA: Insufficient documentation

## 2019-10-25 DIAGNOSIS — Z3042 Encounter for surveillance of injectable contraceptive: Secondary | ICD-10-CM

## 2019-10-25 LAB — HEMOCCULT GUIAC POC 1CARD (OFFICE): Fecal Occult Blood, POC: NEGATIVE

## 2019-10-25 MED ORDER — LISINOPRIL-HYDROCHLOROTHIAZIDE 20-25 MG PO TABS: 1.0000 | ORAL_TABLET | Freq: Every day | ORAL | 4 refills | Status: DC

## 2019-10-25 MED ORDER — NAPROXEN 375 MG PO TABS: 375.0000 mg | ORAL_TABLET | Freq: Two times a day (BID) | ORAL | 1 refills | Status: DC

## 2019-10-25 NOTE — Progress Notes (Signed)
Patient ID: Jennifer Wyatt, female   DOB: 29-Jan-1976, 43 y.o.   MRN: [id number] History of Present Illness:  Jennifer Wyatt is a 43 year old black female, G2P2 in for a well woman gyn exam, she had a normal pap with negative HPV 08/18/18. She is having some cramping on depo and no period.  Current Medications, Allergies, Past Medical History, Past Surgical History, Family History and Social History were reviewed in [employer] record.     Review of Systems: Patient denies any headaches, hearing loss, fatigue, blurred vision, shortness of breath, chest pain, abdominal pain, problems with bowel movements, urination, or intercourse. No joint pain or mood swings. See HPI for positives.    Physical Exam:BP 129/86 (BP Location: Left Arm, Patient Position: Sitting, Cuff Size: Large)   Pulse 85   Ht 5\' 5"  (1.651 m)   Wt 200 lb 8 oz (90.9 kg)   BMI 33.36 kg/m  General:  Well developed, well nourished, no acute distress Skin:  Warm and dry Neck:  Midline trachea, normal thyroid, good ROM, no lymphadenopathy Lungs; Clear to auscultation bilaterally Breast:  No dominant palpable mass, retraction, or nipple discharge Cardiovascular: Regular rate and rhythm Abdomen:  Soft, non tender, no hepatosplenomegaly Pelvic:  External genitalia is normal in appearance, no lesions.  The vagina is normal in appearance. Urethra has no lesions or masses. The cervix is bulbous.  Uterus is felt to be normal size, shape, and contour.  No adnexal masses or tenderness noted.Bladder is non tender, no masses felt. Rectal: Good sphincter tone, no polyps, or hemorrhoids felt.  Hemoccult negative. Extremities/musculoskeletal:  No swelling or varicosities noted, no clubbing or cyanosis Psych:  No mood changes, alert and cooperative,seems happy Fall risk is low PHQ 9 score is 3, denies any SI, and declines meds.  Impression and Plan: 1. Encounter for well woman exam with routine gynecological  exam Physical in 1 year Pap in 2022  2. Screening for colorectal cancer   3. Encounter for surveillance of injectable contraceptive Has refills on depo  4. Uterine cramping Will rx naproxen  5. Essential hypertension Continue BP meds Meds ordered this encounter  Medications  . lisinopril-hydrochlorothiazide (ZESTORETIC) 20-25 MG tablet    Sig: Take 1 tablet by mouth daily.    Dispense:  90 tablet    Refill:  4    Order Specific Question:   Supervising Provider    Answer:   Elonda Husky, LUTHER H [[id number]]  . naproxen (NAPROSYN) 375 MG tablet    Sig: Take 1 tablet (375 mg total) by mouth 2 (two) times daily with a meal.    Dispense:  60 tablet    Refill:  1    Order Specific Question:   Supervising Provider    Answer:   Elonda Husky, LUTHER H [[id number]]    6. Elevated serum creatinine Check CMP

## 2019-10-26 LAB — COMPREHENSIVE METABOLIC PANEL
ALT: 52 IU/L — ABNORMAL HIGH (ref 0–32)
AST: 37 IU/L (ref 0–40)
Albumin/Globulin Ratio: 1.6 (ref 1.2–2.2)
Albumin: 4.6 g/dL (ref 3.8–4.8)
Alkaline Phosphatase: 68 IU/L (ref 39–117)
BUN/Creatinine Ratio: 13 (ref 9–23)
BUN: 15 mg/dL (ref 6–24)
Bilirubin Total: 0.8 mg/dL (ref 0.0–1.2)
CO2: 24 mmol/L (ref 20–29)
Calcium: 9.6 mg/dL (ref 8.7–10.2)
Chloride: 100 mmol/L (ref 96–106)
Creatinine, Ser: 1.15 mg/dL — ABNORMAL HIGH (ref 0.57–1.00)
GFR calc Af Amer: 68 mL/min/{1.73_m2} (ref >59)
GFR calc non Af Amer: 59 mL/min/{1.73_m2} — ABNORMAL LOW (ref >59)
Globulin, Total: 2.8 g/dL (ref 1.5–4.5)
Glucose: 94 mg/dL (ref 65–99)
Potassium: 3.7 mmol/L (ref 3.5–5.2)
Sodium: 137 mmol/L (ref 134–144)
Total Protein: 7.4 g/dL (ref 6.0–8.5)

## 2019-10-27 ENCOUNTER — Telehealth: Payer: Self-pay | Admitting: Adult Health

## 2019-10-27 NOTE — Telephone Encounter (Signed)
Pt aware that serum creatinine is 1.15 which is going up, referred to [facility]. For 11/11 at 11:40 am.

## 2019-11-11 DIAGNOSIS — E559 Vitamin D deficiency, unspecified: Secondary | ICD-10-CM | POA: Diagnosis not present

## 2019-11-11 DIAGNOSIS — N281 Cyst of kidney, acquired: Secondary | ICD-10-CM | POA: Diagnosis not present

## 2019-11-11 DIAGNOSIS — I129 Hypertensive chronic kidney disease with stage 1 through stage 4 chronic kidney disease, or unspecified chronic kidney disease: Secondary | ICD-10-CM | POA: Diagnosis not present

## 2019-11-11 DIAGNOSIS — N182 Chronic kidney disease, stage 2 (mild): Secondary | ICD-10-CM | POA: Diagnosis not present

## 2019-11-30 ENCOUNTER — Ambulatory Visit: Payer: BC Managed Care – PPO

## 2019-12-07 ENCOUNTER — Other Ambulatory Visit: Payer: Self-pay

## 2019-12-07 ENCOUNTER — Ambulatory Visit (INDEPENDENT_AMBULATORY_CARE_PROVIDER_SITE_OTHER): Payer: BC Managed Care – PPO | Admitting: *Deleted

## 2019-12-07 DIAGNOSIS — Z3042 Encounter for surveillance of injectable contraceptive: Secondary | ICD-10-CM

## 2019-12-07 DIAGNOSIS — Z13220 Encounter for screening for lipoid disorders: Secondary | ICD-10-CM

## 2019-12-07 DIAGNOSIS — Z131 Encounter for screening for diabetes mellitus: Secondary | ICD-10-CM

## 2019-12-07 MED ORDER — MEDROXYPROGESTERONE ACETATE 150 MG/ML IM SUSP
150.0000 mg | Freq: Once | INTRAMUSCULAR | Status: AC
  Administered 2019-12-07: 10:00:00 150 mg via INTRAMUSCULAR

## 2019-12-07 NOTE — Progress Notes (Signed)
   NURSE VISIT- INJECTION  SUBJECTIVE:  Jennifer Wyatt is a 43 y.o. G25P2002 female here for a Depo Provera for contraception/period management. She is a GYN patient.   OBJECTIVE:  There were no vitals taken for this visit.  Appears well, in no apparent distress  Injection administered in: Left upper quad. gluteus  No orders of the defined types were placed in this encounter.   ASSESSMENT: GYN patient Depo Provera for contraception/period management PLAN: Follow-up: in 11-13 weeks for next Depo   Elle Vezina, Celene Squibb  12/07/2019 9:26 AM

## 2019-12-10 DIAGNOSIS — Z13220 Encounter for screening for lipoid disorders: Secondary | ICD-10-CM | POA: Diagnosis not present

## 2019-12-11 LAB — LIPID PANEL
Chol/HDL Ratio: 3.2 ratio (ref 0.0–4.4)
Cholesterol, Total: 149 mg/dL (ref 100–199)
HDL: 46 mg/dL (ref >39)
LDL Chol Calc (NIH): 89 mg/dL (ref 0–99)
Triglycerides: 69 mg/dL (ref 0–149)
VLDL Cholesterol Cal: 14 mg/dL (ref 5–40)

## 2019-12-11 LAB — GLUCOSE, RANDOM: Glucose: 83 mg/dL (ref 65–99)

## 2019-12-13 ENCOUNTER — Encounter: Payer: Self-pay | Admitting: *Deleted

## 2019-12-24 ENCOUNTER — Other Ambulatory Visit: Payer: Self-pay | Admitting: Adult Health

## 2019-12-27 ENCOUNTER — Other Ambulatory Visit: Payer: Self-pay | Admitting: *Deleted

## 2019-12-28 MED ORDER — NAPROXEN 375 MG PO TABS: 375.0000 mg | ORAL_TABLET | Freq: Two times a day (BID) | ORAL | 1 refills | Status: DC

## 2020-01-03 ENCOUNTER — Other Ambulatory Visit: Payer: Self-pay

## 2020-01-03 ENCOUNTER — Ambulatory Visit
Admission: EM | Admit: 2020-01-03 | Discharge: 2020-01-03 | Disposition: A | Discharge status: Home / Self Care | Payer: BC Managed Care – PPO | Attending: Emergency Medicine | Admitting: Emergency Medicine

## 2020-01-03 DIAGNOSIS — Z20822 Contact with and (suspected) exposure to covid-19: Secondary | ICD-10-CM | POA: Diagnosis not present

## 2020-01-03 MED ORDER — FLUTICASONE PROPIONATE 50 MCG/ACT NA SUSP: 2.0000 | Freq: Every day | NASAL | 0 refills | Status: DC

## 2020-01-03 MED ORDER — CETIRIZINE HCL 10 MG PO TABS: 10.0000 mg | ORAL_TABLET | Freq: Every day | ORAL | 0 refills | Status: DC

## 2020-01-03 NOTE — Discharge Instructions (Signed)
COVID testing ordered.  It will take between 5-7 days for test results.  Someone will contact you regarding abnormal results.    In the meantime: You should remain isolated in your home for 10 days from symptom onset AND greater than 72 hours after symptoms resolution (absence of fever without the use of fever-reducing medication and improvement in respiratory symptoms), whichever is longer Get plenty of rest and push fluids Use OTC zyrtec for nasal congestion, runny nose, and/or sore throat Use OTC flonase for nasal congestion and runny nose Use medications daily for symptom relief Use OTC medications like ibuprofen or tylenol as needed fever or pain Call or go to the ED if you have any new or worsening symptoms such as fever, worsening cough, shortness of breath, chest tightness, chest pain, turning blue, changes in mental status, etc...  

## 2020-01-03 NOTE — ED Provider Notes (Signed)
[facility]   [id number] 01/03/20 Arrival Time: 0865   CC: COVID symptoms  SUBJECTIVE: History from: patient.  Jennifer Wyatt is a 44 y.o. female who presents with congestion and mild cough x this morning.  Husband tested positive for COVID.  Denies recent travel.  Has NOT tried OTC medications.  Denies aggravating factors.  Reports previous symptoms in the past with a cold.   Denies fever, chills, fatigue, sinus pain, rhinorrhea, sore throat, SOB, wheezing, chest pain, nausea, changes in bowel or bladder habits.    ROS: As per HPI.  All other pertinent ROS negative.     Past Medical History:  Diagnosis Date  . Hypertension   . Varicosities    History reviewed. No pertinent surgical history. No Known Allergies No current facility-administered medications on file prior to encounter.   Current Outpatient Medications on File Prior to Encounter  Medication Sig Dispense Refill  . lisinopril-hydrochlorothiazide (ZESTORETIC) 20-25 MG tablet Take 1 tablet by mouth daily. 90 tablet 4  . medroxyPROGESTERone (DEPO-PROVERA) 150 MG/ML injection INJECT INTO THE MUSCLE EVERY 11 WEEKS AT OFFICE 1 mL 3  . naproxen (NAPROSYN) 375 MG tablet Take 1 tablet (375 mg total) by mouth 2 (two) times daily with a meal. 60 tablet 1   Social History   Socioeconomic History  . Marital status: Married    Spouse name: Not on file  . Number of children: Not on file  . Years of education: Not on file  . Highest education level: Not on file  Occupational History  . Not on file  Tobacco Use  . Smoking status: Never Smoker  . Smokeless tobacco: Never Used  Substance and Sexual Activity  . Alcohol use: Yes    Comment: occ.  . Drug use: No  . Sexual activity: Yes    Partners: Male    Birth control/protection: Injection  Other Topics Concern  . Not on file  Social History Narrative  . Not on file   Social Determinants of Health   Financial Resource Strain:   . Difficulty of Paying  Living Expenses: Not on file  Food Insecurity:   . Worried About Charity fundraiser in the Last Year: Not on file  . Ran Out of Food in the Last Year: Not on file  Transportation Needs:   . Lack of Transportation (Medical): Not on file  . Lack of Transportation (Non-Medical): Not on file  Physical Activity:   . Days of Exercise per Week: Not on file  . Minutes of Exercise per Session: Not on file  Stress:   . Feeling of Stress : Not on file  Social Connections:   . Frequency of Communication with Friends and Family: Not on file  . Frequency of Social Gatherings with Friends and Family: Not on file  . Attends Religious Services: Not on file  . Active Member of Clubs or Organizations: Not on file  . Attends Archivist Meetings: Not on file  . Marital Status: Not on file  Intimate Partner Violence:   . Fear of Current or Ex-Partner: Not on file  . Emotionally Abused: Not on file  . Physically Abused: Not on file  . Sexually Abused: Not on file   Family History  Problem Relation Age of Onset  . Thyroid disease Mother   . Diabetes Maternal Aunt   . Kidney failure Maternal Aunt     OBJECTIVE:  Vitals:   01/03/20 0912  BP: 132/85  Pulse: 87  Resp:  16  Temp: 98.4 F (36.9 C)  TempSrc: Oral  SpO2: 97%     General appearance: alert; well-appearing, nontoxic; speaking in full sentences and tolerating own secretions HEENT: NCAT; Ears: EACs clear, TMs pearly gray; Eyes: PERRL.  EOM grossly intact. Nose: nares patent without rhinorrhea, turbinates pale and boggy, Throat: oropharynx clear, tonsils non erythematous or enlarged, uvula midline  Neck: supple without LAD Lungs: unlabored respirations, symmetrical air entry; cough: absent; no respiratory distress; CTAB Heart: regular rate and rhythm.  Skin: warm and dry Psychological: alert and cooperative; normal mood and affect  ASSESSMENT & PLAN:  1. Suspected COVID-19 virus infection   2. Close exposure to COVID-19  virus     Meds ordered this encounter  Medications  . cetirizine (ZYRTEC) 10 MG tablet    Sig: Take 1 tablet (10 mg total) by mouth daily.    Dispense:  30 tablet    Refill:  0    Order Specific Question:   Supervising Provider    Answer:   Eustace Moore [[id number]]  . fluticasone (FLONASE) 50 MCG/ACT nasal spray    Sig: Place 2 sprays into both nostrils daily.    Dispense:  16 g    Refill:  0    Order Specific Question:   Supervising Provider    Answer:   Eustace Moore [[id number]]   COVID testing ordered.  It will take between 5-7 days for test results.  Someone will contact you regarding abnormal results.    In the meantime: You should remain isolated in your home for 10 days from symptom onset AND greater than 72 hours after symptoms resolution (absence of fever without the use of fever-reducing medication and improvement in respiratory symptoms), whichever is longer Get plenty of rest and push fluids Use OTC zyrtec for nasal congestion, runny nose, and/or sore throat Use OTC flonase for nasal congestion and runny nose Use medications daily for symptom relief Use OTC medications like ibuprofen or tylenol as needed fever or pain Call or go to the ED if you have any new or worsening symptoms such as fever, worsening cough, shortness of breath, chest tightness, chest pain, turning blue, changes in mental status, etc...   Reviewed expectations re: course of current medical issues. Questions answered. Outlined signs and symptoms indicating need for more acute intervention. Patient verbalized understanding. After Visit Summary given.         Rennis Harding, PA-C 01/03/20 [phone]

## 2020-01-03 NOTE — ED Triage Notes (Signed)
Pt presents to UC w/ c/o congestion starting today. Pt states she had positive covid exposure to her husband.

## 2020-01-04 ENCOUNTER — Other Ambulatory Visit: Payer: BC Managed Care – PPO

## 2020-01-04 LAB — NOVEL CORONAVIRUS, NAA: SARS-CoV-2, NAA: DETECTED — AB

## 2020-01-05 ENCOUNTER — Encounter (HOSPITAL_COMMUNITY): Payer: Self-pay

## 2020-01-05 ENCOUNTER — Telehealth (HOSPITAL_COMMUNITY): Payer: Self-pay | Admitting: Emergency Medicine

## 2020-01-05 NOTE — Telephone Encounter (Signed)
Patient contacted by phone and made aware of  positive covid  results. Pt verbalized understanding and had all questions answered.    

## 2020-01-05 NOTE — Telephone Encounter (Signed)

## 2020-02-29 ENCOUNTER — Encounter: Payer: Self-pay | Admitting: *Deleted

## 2020-02-29 ENCOUNTER — Ambulatory Visit (INDEPENDENT_AMBULATORY_CARE_PROVIDER_SITE_OTHER): Payer: BC Managed Care – PPO | Admitting: *Deleted

## 2020-02-29 ENCOUNTER — Other Ambulatory Visit: Payer: Self-pay

## 2020-02-29 DIAGNOSIS — Z3202 Encounter for pregnancy test, result negative: Secondary | ICD-10-CM | POA: Diagnosis not present

## 2020-02-29 DIAGNOSIS — Z3042 Encounter for surveillance of injectable contraceptive: Secondary | ICD-10-CM

## 2020-02-29 DIAGNOSIS — Z308 Encounter for other contraceptive management: Secondary | ICD-10-CM

## 2020-02-29 LAB — POCT URINE PREGNANCY: Preg Test, Ur: NEGATIVE

## 2020-02-29 MED ORDER — MEDROXYPROGESTERONE ACETATE 150 MG/ML IM SUSP
150.0000 mg | Freq: Once | INTRAMUSCULAR | Status: AC
  Administered 2020-02-29: 10:00:00 150 mg via INTRAMUSCULAR

## 2020-02-29 NOTE — Progress Notes (Signed)
   NURSE VISIT- INJECTION  SUBJECTIVE:  Jennifer Wyatt is a 44 y.o. G66P2002 female here for a Depo Provera for contraception/period management. She is a GYN patient.   OBJECTIVE:  There were no vitals taken for this visit.  Appears well, in no apparent distress  Injection administered in: Left upper quad. gluteus  Meds ordered this encounter  Medications  . medroxyPROGESTERone (DEPO-PROVERA) injection 150 mg    ASSESSMENT: GYN patient Depo Provera for contraception/period management PLAN: Follow-up: in 11-13 weeks for next Depo   [name]  02/29/2020 9:48 AM

## 2020-03-31 ENCOUNTER — Ambulatory Visit: Payer: BC Managed Care – PPO | Attending: Internal Medicine

## 2020-03-31 DIAGNOSIS — Z23 Encounter for immunization: Secondary | ICD-10-CM

## 2020-03-31 NOTE — Progress Notes (Signed)
   Covid-19 Vaccination Clinic  Name:  Jennifer Wyatt    MRN: [id number] DOB: 1976-03-06  03/31/2020  Ms. Gaymon was observed post Covid-19 immunization for 15 minutes without incident. She was provided with Vaccine Information Sheet and instruction to access the V-Safe system.   Ms. Germano was instructed to call 911 with any severe reactions post vaccine: Marland Kitchen Difficulty breathing  . Swelling of face and throat  . A fast heartbeat  . A bad rash all over body  . Dizziness and weakness   Immunizations Administered    Name Date Dose VIS Date Route   Moderna COVID-19 Vaccine 03/31/2020 12:31 PM 0.5 mL 11/30/2019 Intramuscular   Manufacturer: Moderna   Lot: [id number]   NDC: 81856-314-97

## 2020-04-07 IMAGING — US RENAL/URINARY TRACT ULTRASOUND
1 series · 14 of 25 positions shown · non-contrast
Comparison: None.

CLINICAL DATA: Chronic renal disease

EXAM:
RENAL / URINARY TRACT ULTRASOUND COMPLETE

[Series 1: renal/urinary tract ultrasound · 0.21mm/px · 14 of 48 slices shown]
[im 1/48]
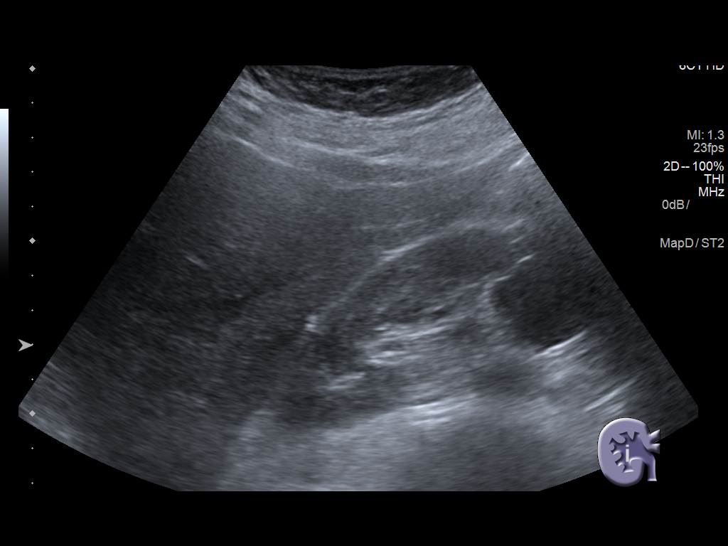
[im 4/48]
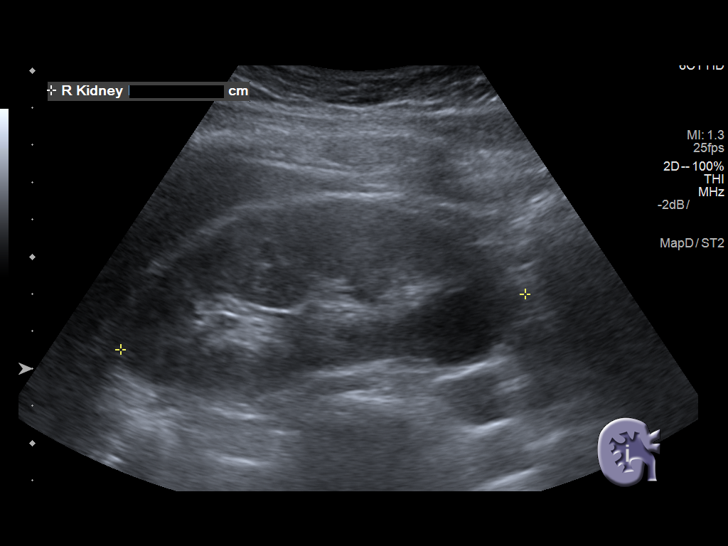
[im 8/48]
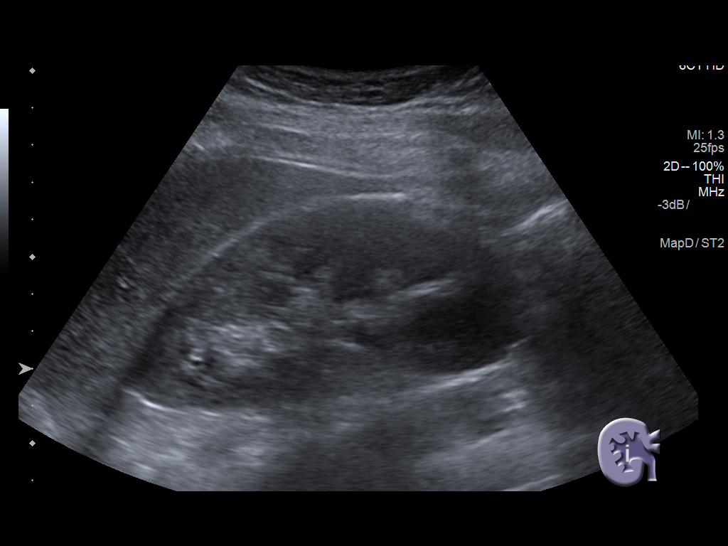
[im 12/48]
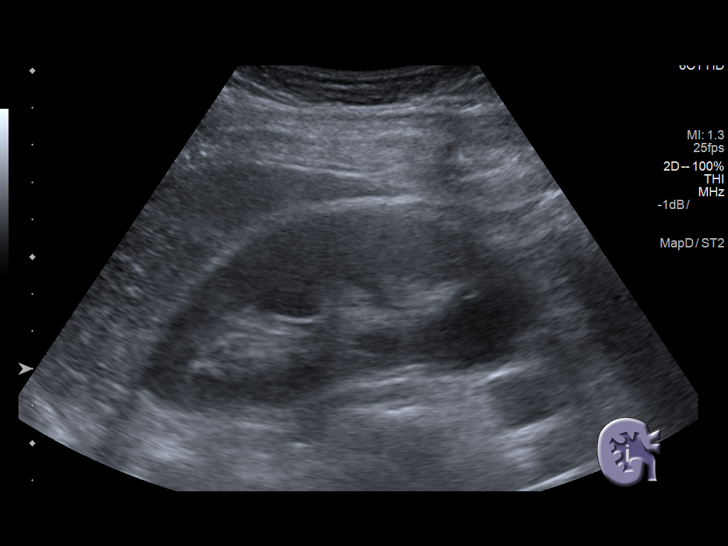
[im 16/48]
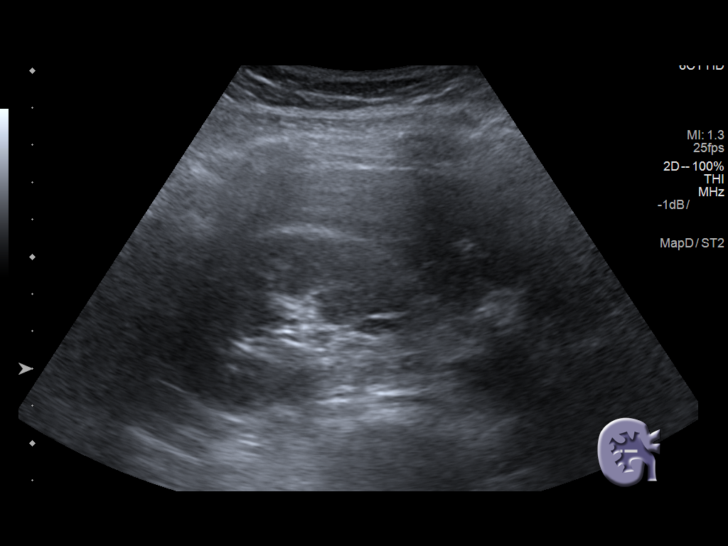
[im 18/48]
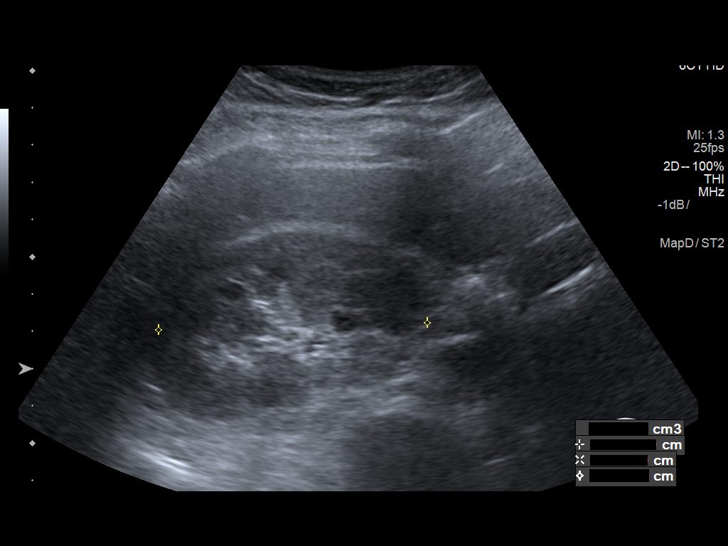
[im 22/48]
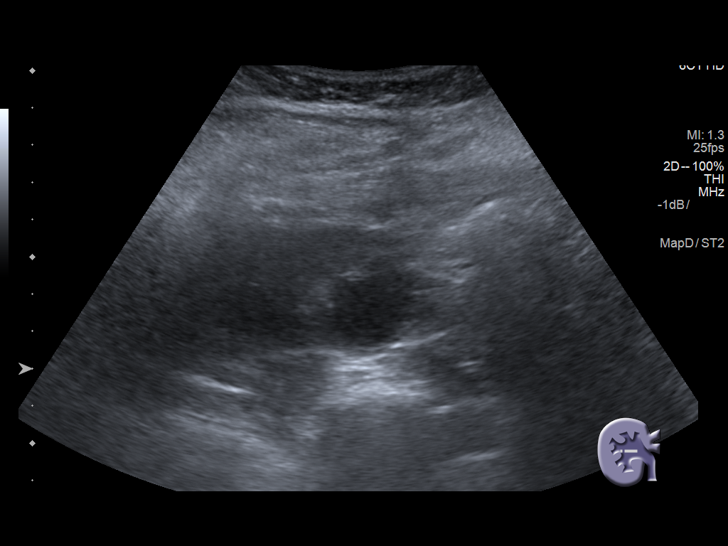
[im 26/48]
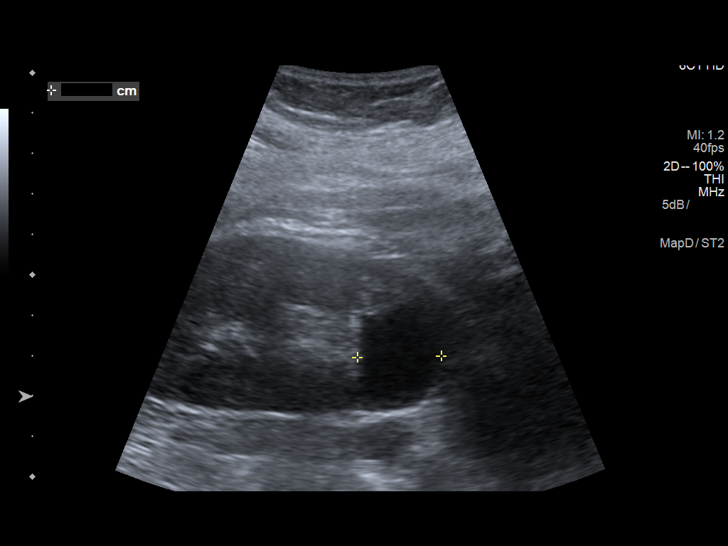
[im 30/48]
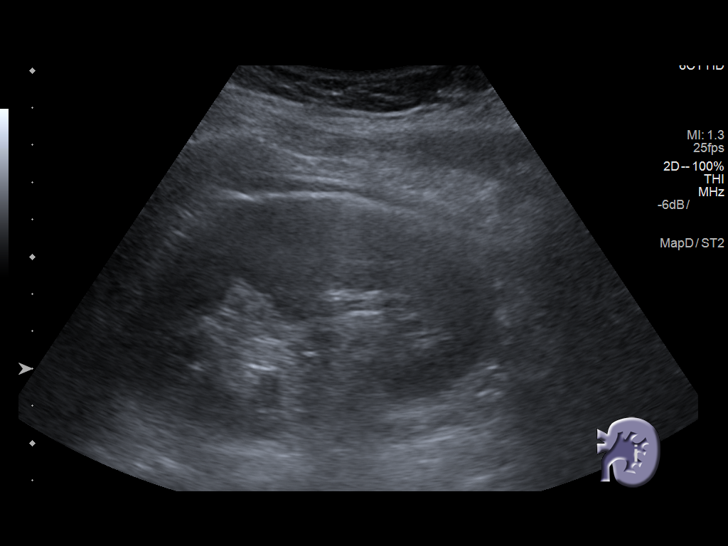
[im 32/48]
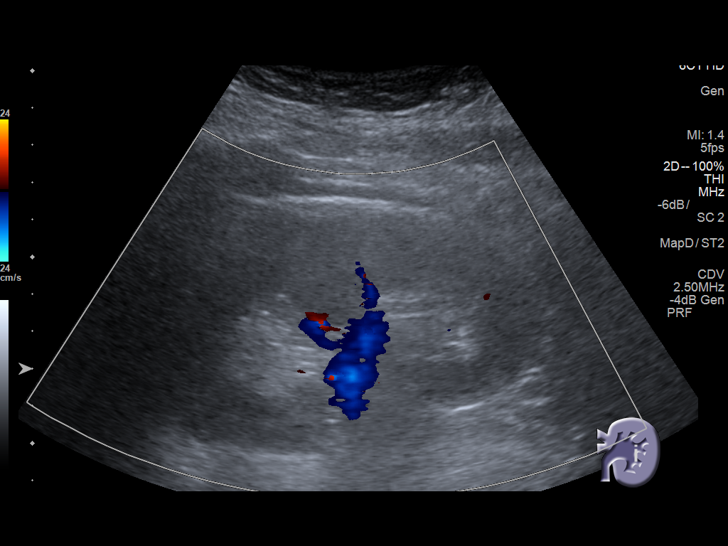
[im 36/48]
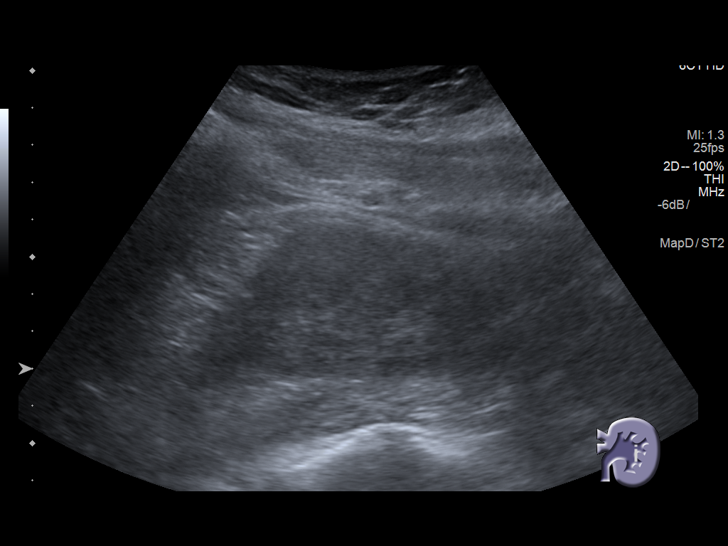
[im 40/48]
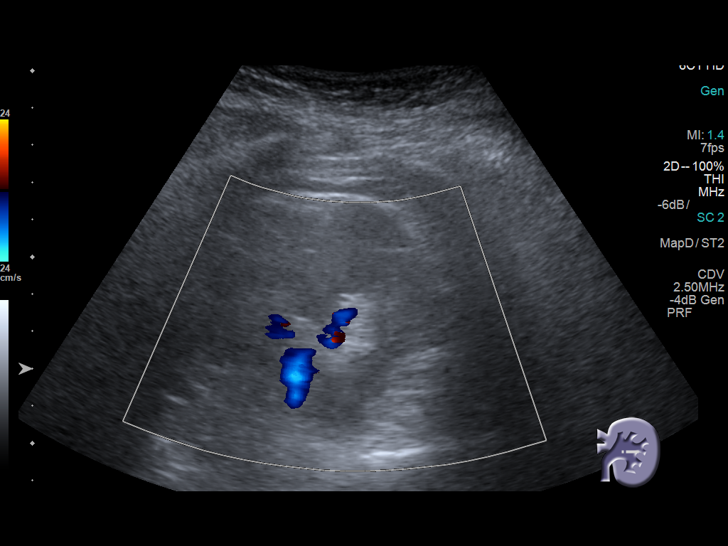
[im 44/48]
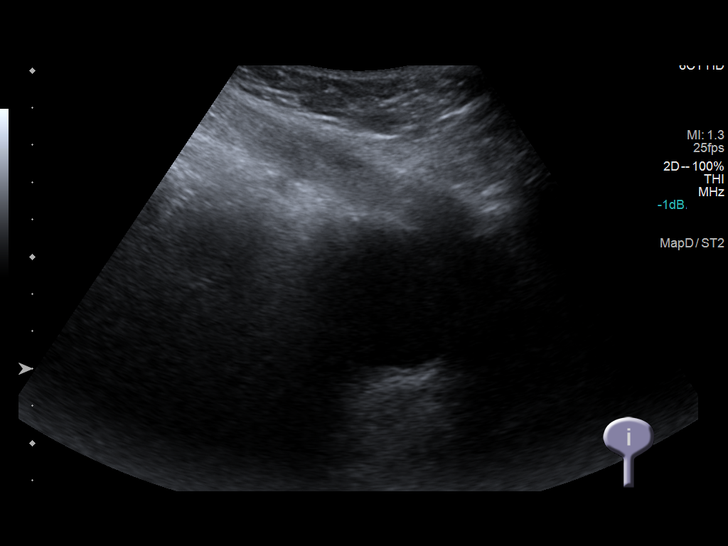
[im 48/48]
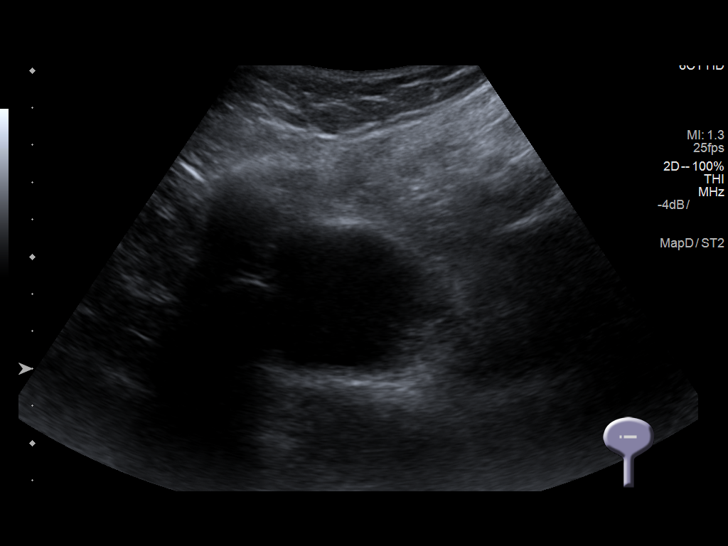

[14 of 25 positions shown; findings below may reference images not displayed]

FINDINGS: Right Kidney:

Renal measurements: 11.0 x 4.6 x 7.2 cm. = volume: 191 mL.
Echogenicity is within normal limits. No obstructive changes are
noted. 3.2 cm cyst is noted in the lower pole of the right kidney.

Left Kidney:

Renal measurements: 10.8 x 5.8 x 6.2 cm. = volume: 203 mL.
Echogenicity within normal limits. No mass or hydronephrosis
visualized.

Bladder:

Appears normal for degree of bladder distention.
IMPRESSION: Right renal cyst.

No other focal abnormality is noted.

## 2020-05-02 ENCOUNTER — Ambulatory Visit: Payer: BC Managed Care – PPO

## 2020-05-03 ENCOUNTER — Ambulatory Visit: Payer: BC Managed Care – PPO | Attending: Internal Medicine

## 2020-05-03 DIAGNOSIS — Z23 Encounter for immunization: Secondary | ICD-10-CM

## 2020-05-03 NOTE — Progress Notes (Signed)
   Covid-19 Vaccination Clinic  Name:  SHRISTI SCHEIB    MRN: [id number] DOB: September 02, 1976  05/03/2020  Ms. Burbano was observed post Covid-19 immunization for 15 minutes without incident. She was provided with Vaccine Information Sheet and instruction to access the V-Safe system.   Ms. Gagliano was instructed to call 911 with any severe reactions post vaccine: Marland Kitchen Difficulty breathing  . Swelling of face and throat  . A fast heartbeat  . A bad rash all over body  . Dizziness and weakness   Immunizations Administered    Name Date Dose VIS Date Route   Moderna COVID-19 Vaccine 05/03/2020  8:34 AM 0.5 mL 11/2019 Intramuscular   Manufacturer: Moderna   Lot: [id number]   NDC: 83074-600-29

## 2020-05-09 DIAGNOSIS — Z79899 Other long term (current) drug therapy: Secondary | ICD-10-CM | POA: Diagnosis not present

## 2020-05-09 DIAGNOSIS — R809 Proteinuria, unspecified: Secondary | ICD-10-CM | POA: Diagnosis not present

## 2020-05-09 DIAGNOSIS — D631 Anemia in chronic kidney disease: Secondary | ICD-10-CM | POA: Diagnosis not present

## 2020-05-09 DIAGNOSIS — N1831 Chronic kidney disease, stage 3a: Secondary | ICD-10-CM | POA: Diagnosis not present

## 2020-05-10 DIAGNOSIS — I129 Hypertensive chronic kidney disease with stage 1 through stage 4 chronic kidney disease, or unspecified chronic kidney disease: Secondary | ICD-10-CM | POA: Diagnosis not present

## 2020-05-10 DIAGNOSIS — E6609 Other obesity due to excess calories: Secondary | ICD-10-CM | POA: Diagnosis not present

## 2020-05-10 DIAGNOSIS — E559 Vitamin D deficiency, unspecified: Secondary | ICD-10-CM | POA: Diagnosis not present

## 2020-05-10 DIAGNOSIS — N182 Chronic kidney disease, stage 2 (mild): Secondary | ICD-10-CM | POA: Diagnosis not present

## 2020-05-21 ENCOUNTER — Other Ambulatory Visit: Payer: Self-pay | Admitting: Adult Health

## 2020-05-23 ENCOUNTER — Ambulatory Visit (INDEPENDENT_AMBULATORY_CARE_PROVIDER_SITE_OTHER): Payer: BC Managed Care – PPO | Admitting: *Deleted

## 2020-05-23 DIAGNOSIS — Z3042 Encounter for surveillance of injectable contraceptive: Secondary | ICD-10-CM

## 2020-05-23 MED ORDER — MEDROXYPROGESTERONE ACETATE 150 MG/ML IM SUSP
150.0000 mg | Freq: Once | INTRAMUSCULAR | Status: AC
  Administered 2020-05-23: 150 mg via INTRAMUSCULAR

## 2020-05-23 NOTE — Progress Notes (Signed)
   NURSE VISIT- INJECTION  SUBJECTIVE:  Jennifer Wyatt is a 44 y.o. G37P2002 female here for a Depo Provera for contraception/period management. She is a GYN patient.   OBJECTIVE:  There were no vitals taken for this visit.  Appears well, in no apparent distress  Injection administered in: Right upper quad. gluteus  Meds ordered this encounter  Medications  . medroxyPROGESTERone (DEPO-PROVERA) injection 150 mg    ASSESSMENT: GYN patient Depo Provera for contraception/period management PLAN: Follow-up: in 11-13 weeks for next Depo   Stoney Bang  05/23/2020 9:47 AM

## 2020-08-15 ENCOUNTER — Ambulatory Visit (INDEPENDENT_AMBULATORY_CARE_PROVIDER_SITE_OTHER): Payer: BC Managed Care – PPO | Admitting: *Deleted

## 2020-08-15 ENCOUNTER — Other Ambulatory Visit: Payer: Self-pay

## 2020-08-15 DIAGNOSIS — Z3042 Encounter for surveillance of injectable contraceptive: Secondary | ICD-10-CM | POA: Diagnosis not present

## 2020-08-15 MED ORDER — MEDROXYPROGESTERONE ACETATE 150 MG/ML IM SUSP
150.0000 mg | Freq: Once | INTRAMUSCULAR | Status: AC
  Administered 2020-08-15: 150 mg via INTRAMUSCULAR

## 2020-08-15 NOTE — Progress Notes (Signed)
   NURSE VISIT- INJECTION  SUBJECTIVE:  Jennifer Wyatt is a 44 y.o. G33P2002 female here for a Depo Provera for contraception/period management. She is a GYN patient.   OBJECTIVE:  There were no vitals taken for this visit.  Appears well, in no apparent distress  Injection administered in: Left upper quad. gluteus  No orders of the defined types were placed in this encounter.   ASSESSMENT: GYN patient Depo Provera for contraception/period management PLAN: Follow-up: in 11-13 weeks for next Depo   Jennifer Wyatt  08/15/2020 9:29 AM

## 2020-10-29 ENCOUNTER — Other Ambulatory Visit: Payer: Self-pay | Admitting: Adult Health

## 2020-11-02 ENCOUNTER — Ambulatory Visit: Payer: BC Managed Care – PPO

## 2020-11-02 ENCOUNTER — Other Ambulatory Visit: Payer: BC Managed Care – PPO | Admitting: Adult Health

## 2020-11-02 ENCOUNTER — Ambulatory Visit (INDEPENDENT_AMBULATORY_CARE_PROVIDER_SITE_OTHER): Payer: BC Managed Care – PPO | Admitting: Women's Health

## 2020-11-02 ENCOUNTER — Encounter: Payer: Self-pay | Admitting: Women's Health

## 2020-11-02 ENCOUNTER — Other Ambulatory Visit: Payer: Self-pay

## 2020-11-02 ENCOUNTER — Other Ambulatory Visit (HOSPITAL_COMMUNITY)
Admission: RE | Admit: 2020-11-02 | Discharge: 2020-11-02 | Disposition: A | Discharge status: Home / Self Care | Payer: BC Managed Care – PPO | Source: Ambulatory Visit | Attending: Obstetrics & Gynecology | Admitting: Obstetrics & Gynecology

## 2020-11-02 VITALS — BP 128/82 | HR 72 | Ht 64.0 in | Wt 202.0 lb

## 2020-11-02 DIAGNOSIS — Z3042 Encounter for surveillance of injectable contraceptive: Secondary | ICD-10-CM | POA: Diagnosis not present

## 2020-11-02 DIAGNOSIS — Z0000 Encounter for general adult medical examination without abnormal findings: Secondary | ICD-10-CM | POA: Diagnosis not present

## 2020-11-02 DIAGNOSIS — Z01419 Encounter for gynecological examination (general) (routine) without abnormal findings: Secondary | ICD-10-CM | POA: Diagnosis not present

## 2020-11-02 DIAGNOSIS — Z3009 Encounter for other general counseling and advice on contraception: Secondary | ICD-10-CM | POA: Diagnosis not present

## 2020-11-02 MED ORDER — MEDROXYPROGESTERONE ACETATE 150 MG/ML IM SUSP
150.0000 mg | Freq: Once | INTRAMUSCULAR | Status: AC
  Administered 2020-11-02: 150 mg via INTRAMUSCULAR

## 2020-11-02 NOTE — Patient Instructions (Addendum)
Call [facility] at [phone] to schedule your mammogram  Let me know what you decide about getting an IUD  Take VitD 3 daily

## 2020-11-02 NOTE — Progress Notes (Signed)
WELL-WOMAN EXAMINATION Patient name: Jennifer Wyatt MRN [id number]  Date of birth: 23-Sep-1976 Chief Complaint:   Gynecologic Exam and Contraception (Depo injection)  History of Present Illness:   Jennifer Wyatt is a 44 y.o. G53P2002 African American female being seen today for a routine well-woman exam.  Current complaints: mild cramping for past few months, no bleeding/periods w/ depo. Denies abnormal discharge, itching/odor/irritation.  Has been on depo x 63yrs, wants to know if she needs to come off for awhile  Depression screen [facility] 2/9 11/02/2020 10/25/2019 10/25/2019 08/18/2018 08/18/2018  Decreased Interest 1 2 2 3 3   Down, Depressed, Hopeless 1 1 1 2 2   PHQ - 2 Score 2 3 3 5 5   Altered sleeping 1 0 - 3 -  Tired, decreased energy 1 0 - 2 -  Change in appetite 1 0 - 0 -  Feeling bad or failure about yourself  0 0 - 1 -  Trouble concentrating 0 0 - 0 -  Moving slowly or fidgety/restless 0 0 - 0 -  Suicidal thoughts 0 0 - 0 -  PHQ-9 Score 5 3 - 11 -  Difficult doing work/chores - - - Somewhat difficult -     PCP: none      does not desire labs No LMP recorded. Patient has had an injection. The current method of family planning is Depo-Provera injections.  Last pap 08/18/18. Results were: normal. H/O abnormal pap: no Last mammogram: never. Results were: N/A. Family h/o breast cancer: no Last colonoscopy: never. Results were: N/A. Family h/o colorectal cancer: no Review of Systems:   Pertinent items are noted in HPI Denies any headaches, blurred vision, fatigue, shortness of breath, chest pain, abdominal pain, abnormal vaginal discharge/itching/odor/irritation, problems with periods, bowel movements, urination, or intercourse unless otherwise stated above. Pertinent History Reviewed:  Reviewed past medical,surgical, social and family history.  Reviewed problem list, medications and allergies. Physical Assessment:   Vitals:   11/02/20 0845  BP: 128/82  Pulse: 72    Weight: 202 lb (91.6 kg)  Height: 5\' 4"  (1.626 m)  Body mass index is 34.67 kg/m.        Physical Examination:   General appearance - well appearing, and in no distress  Mental status - alert, oriented to person, place, and time  Psych:  She has a normal mood and affect  Skin - warm and dry, normal color, no suspicious lesions noted  Chest - effort normal, all lung fields clear to auscultation bilaterally  Heart - normal rate and regular rhythm  Neck:  midline trachea, no thyromegaly or nodules  Breasts - breasts appear normal, no suspicious masses, no skin or nipple changes or  axillary nodes  Abdomen - soft, nontender, nondistended, no masses or organomegaly  Pelvic - VULVA: normal appearing vulva with no masses, tenderness or lesions  VAGINA: normal appearing vagina with normal color and discharge, no lesions  CERVIX: normal appearing cervix without discharge or lesions, no CMT  Thin prep pap is done w/ HR HPV cotesting  UTERUS: uterus is felt to be normal size, shape, consistency and nontender   ADNEXA: No adnexal masses or tenderness noted.  Extremities:  No swelling or varicosities noted  Chaperone: Angel Neas    No results found for this or any previous visit (from the past 24 hour(s)).  Assessment & Plan:  1) Well-Woman Exam  2) Contraception management> received depo today  3) Contraception counseling> has been on depo x 77yrs straight, discussed long-term depo  use and increased r/f bone loss. Take VitD.  Interested in IUD, discussed options. Will let us know  4) Mild cramping> x few months, normal exam, discussed pelvic u/s, will let us know if she decides she wants  Labs/procedures today: pap, depo  Mammogram- needs screening asap (34yrs past-due), gave number to call to schedule  Colonoscopy @44yo  or sooner if problems  No orders of the defined types were placed in this encounter.   Meds:  Meds ordered this encounter  Medications  . medroxyPROGESTERone  (DEPO-PROVERA) injection 150 mg    Follow-up: Return for 11-13wks for depo; 43yr for physical.  3yr CNM, WHNP-BC 11/02/2020 9:18 AM

## 2020-11-07 ENCOUNTER — Encounter: Payer: Self-pay | Admitting: Women's Health

## 2020-11-07 DIAGNOSIS — N182 Chronic kidney disease, stage 2 (mild): Secondary | ICD-10-CM | POA: Diagnosis not present

## 2020-11-07 DIAGNOSIS — E6609 Other obesity due to excess calories: Secondary | ICD-10-CM | POA: Diagnosis not present

## 2020-11-07 DIAGNOSIS — E559 Vitamin D deficiency, unspecified: Secondary | ICD-10-CM | POA: Diagnosis not present

## 2020-11-07 DIAGNOSIS — I129 Hypertensive chronic kidney disease with stage 1 through stage 4 chronic kidney disease, or unspecified chronic kidney disease: Secondary | ICD-10-CM | POA: Diagnosis not present

## 2020-11-07 DIAGNOSIS — R87810 Cervical high risk human papillomavirus (HPV) DNA test positive: Secondary | ICD-10-CM | POA: Insufficient documentation

## 2020-11-07 LAB — CYTOLOGY - PAP
Comment: NEGATIVE
Comment: NEGATIVE
Diagnosis: NEGATIVE
HPV 16: NEGATIVE
HPV 18 / 45: NEGATIVE
High risk HPV: POSITIVE — AB

## 2020-11-09 ENCOUNTER — Other Ambulatory Visit: Payer: Self-pay | Admitting: Women's Health

## 2020-11-10 DIAGNOSIS — I129 Hypertensive chronic kidney disease with stage 1 through stage 4 chronic kidney disease, or unspecified chronic kidney disease: Secondary | ICD-10-CM | POA: Diagnosis not present

## 2020-11-10 DIAGNOSIS — E559 Vitamin D deficiency, unspecified: Secondary | ICD-10-CM | POA: Diagnosis not present

## 2020-11-10 DIAGNOSIS — N182 Chronic kidney disease, stage 2 (mild): Secondary | ICD-10-CM | POA: Diagnosis not present

## 2020-11-10 DIAGNOSIS — E6609 Other obesity due to excess calories: Secondary | ICD-10-CM | POA: Diagnosis not present

## 2021-01-18 ENCOUNTER — Ambulatory Visit (INDEPENDENT_AMBULATORY_CARE_PROVIDER_SITE_OTHER): Payer: BC Managed Care – PPO

## 2021-01-18 ENCOUNTER — Other Ambulatory Visit: Payer: Self-pay

## 2021-01-18 DIAGNOSIS — Z3042 Encounter for surveillance of injectable contraceptive: Secondary | ICD-10-CM | POA: Diagnosis not present

## 2021-01-18 MED ORDER — MEDROXYPROGESTERONE ACETATE 150 MG/ML IM SUSP
150.0000 mg | Freq: Once | INTRAMUSCULAR | Status: AC
  Administered 2021-01-18: 150 mg via INTRAMUSCULAR

## 2021-01-18 NOTE — Progress Notes (Signed)
   NURSE VISIT- INJECTION  SUBJECTIVE:  Jennifer Wyatt is a 45 y.o. G48P2002 female here for a Depo Provera for contraception/period management. She is a GYN patient.   OBJECTIVE:  There were no vitals taken for this visit.  Appears well, in no apparent distress  Injection administered in: Left upper quad. gluteus  Meds ordered this encounter  Medications  . medroxyPROGESTERone (DEPO-PROVERA) injection 150 mg    ASSESSMENT: GYN patient Depo Provera for contraception/period management PLAN: Follow-up: in 11-13 weeks for next Depo   Sindee Stucker A Rilea Arutyunyan  01/18/2021 9:40 AM

## 2021-01-26 ENCOUNTER — Other Ambulatory Visit: Payer: Self-pay | Admitting: Women's Health

## 2021-04-09 ENCOUNTER — Ambulatory Visit (INDEPENDENT_AMBULATORY_CARE_PROVIDER_SITE_OTHER): Payer: BC Managed Care – PPO | Admitting: *Deleted

## 2021-04-09 ENCOUNTER — Other Ambulatory Visit: Payer: Self-pay

## 2021-04-09 DIAGNOSIS — Z3042 Encounter for surveillance of injectable contraceptive: Secondary | ICD-10-CM

## 2021-04-09 MED ORDER — MEDROXYPROGESTERONE ACETATE 150 MG/ML IM SUSP
150.0000 mg | Freq: Once | INTRAMUSCULAR | Status: AC
  Administered 2021-04-09: 150 mg via INTRAMUSCULAR

## 2021-04-09 NOTE — Progress Notes (Signed)
   NURSE VISIT- INJECTION  SUBJECTIVE:  Jennifer Wyatt is a 45 y.o. G3P2002 female here for a Depo Provera for contraception/period management. She is a GYN patient.   OBJECTIVE:  There were no vitals taken for this visit.  Appears well, in no apparent distress  Injection administered in: Right upper quad. gluteus  Meds ordered this encounter  Medications  . medroxyPROGESTERone (DEPO-PROVERA) injection 150 mg    ASSESSMENT: GYN patient Depo Provera for contraception/period management PLAN: Follow-up: in 11-13 weeks for next Depo   [name]  04/09/2021 9:27 AM

## 2021-05-04 DIAGNOSIS — I129 Hypertensive chronic kidney disease with stage 1 through stage 4 chronic kidney disease, or unspecified chronic kidney disease: Secondary | ICD-10-CM | POA: Diagnosis not present

## 2021-05-04 DIAGNOSIS — E6609 Other obesity due to excess calories: Secondary | ICD-10-CM | POA: Diagnosis not present

## 2021-05-04 DIAGNOSIS — E559 Vitamin D deficiency, unspecified: Secondary | ICD-10-CM | POA: Diagnosis not present

## 2021-05-04 DIAGNOSIS — N182 Chronic kidney disease, stage 2 (mild): Secondary | ICD-10-CM | POA: Diagnosis not present

## 2021-05-11 DIAGNOSIS — I129 Hypertensive chronic kidney disease with stage 1 through stage 4 chronic kidney disease, or unspecified chronic kidney disease: Secondary | ICD-10-CM | POA: Diagnosis not present

## 2021-05-11 DIAGNOSIS — E6609 Other obesity due to excess calories: Secondary | ICD-10-CM | POA: Diagnosis not present

## 2021-05-11 DIAGNOSIS — E559 Vitamin D deficiency, unspecified: Secondary | ICD-10-CM | POA: Diagnosis not present

## 2021-05-11 DIAGNOSIS — N1831 Chronic kidney disease, stage 3a: Secondary | ICD-10-CM | POA: Diagnosis not present

## 2021-06-27 ENCOUNTER — Other Ambulatory Visit: Payer: Self-pay

## 2021-06-27 ENCOUNTER — Ambulatory Visit (INDEPENDENT_AMBULATORY_CARE_PROVIDER_SITE_OTHER): Payer: BC Managed Care – PPO | Admitting: *Deleted

## 2021-06-27 DIAGNOSIS — Z308 Encounter for other contraceptive management: Secondary | ICD-10-CM | POA: Diagnosis not present

## 2021-06-27 MED ORDER — MEDROXYPROGESTERONE ACETATE 150 MG/ML IM SUSP
150.0000 mg | Freq: Once | INTRAMUSCULAR | Status: AC
  Administered 2021-06-27: 150 mg via INTRAMUSCULAR

## 2021-06-27 NOTE — Progress Notes (Signed)
   NURSE VISIT- INJECTION  SUBJECTIVE:  Jennifer Wyatt is a 45 y.o. G65P2002 female here for a Depo Provera for contraception/period management. She is a GYN patient.   OBJECTIVE:  There were no vitals taken for this visit.  Appears well, in no apparent distress  Injection administered in: Left upper quad. gluteus  Meds ordered this encounter  Medications   medroxyPROGESTERone (DEPO-PROVERA) injection 150 mg    ASSESSMENT: GYN patient Depo Provera for contraception/period management PLAN: Follow-up: in 11-13 weeks for next Depo   [name]  06/27/2021 9:54 AM

## 2021-09-17 ENCOUNTER — Other Ambulatory Visit: Payer: Self-pay | Admitting: Women's Health

## 2021-09-19 ENCOUNTER — Other Ambulatory Visit: Payer: Self-pay

## 2021-09-19 ENCOUNTER — Ambulatory Visit (INDEPENDENT_AMBULATORY_CARE_PROVIDER_SITE_OTHER): Payer: BC Managed Care – PPO

## 2021-09-19 DIAGNOSIS — Z3042 Encounter for surveillance of injectable contraceptive: Secondary | ICD-10-CM | POA: Diagnosis not present

## 2021-09-19 MED ORDER — MEDROXYPROGESTERONE ACETATE 150 MG/ML IM SUSP
150.0000 mg | Freq: Once | INTRAMUSCULAR | Status: AC
  Administered 2021-09-19: 150 mg via INTRAMUSCULAR

## 2021-09-19 NOTE — Progress Notes (Signed)
   NURSE VISIT- INJECTION  SUBJECTIVE:  Jennifer Wyatt is a 45 y.o. G37P2002 female here for a Depo Provera for contraception/period management. She is a GYN patient.   OBJECTIVE:  There were no vitals taken for this visit.  Appears well, in no apparent distress  Injection administered in: Right upper quad. gluteus  Meds ordered this encounter  Medications   medroxyPROGESTERone (DEPO-PROVERA) injection 150 mg    ASSESSMENT: GYN patient Depo Provera for contraception/period management PLAN: Follow-up: in 11-13 weeks for next Depo   Ketih Goodie A Rosetta Rupnow  09/19/2021 9:25 AM

## 2021-09-26 ENCOUNTER — Telehealth: Payer: Self-pay | Admitting: Adult Health

## 2021-09-26 NOTE — Telephone Encounter (Signed)
Pt has PAP scheduled here 11/15/2021 - would like [place] to order labs so she can get them done now, needs them for her wellness program at work & needs results ASAP  Please advise & notify pt

## 2021-09-27 ENCOUNTER — Other Ambulatory Visit: Payer: BC Managed Care – PPO

## 2021-09-27 DIAGNOSIS — Z13220 Encounter for screening for lipoid disorders: Secondary | ICD-10-CM | POA: Diagnosis not present

## 2021-09-27 DIAGNOSIS — Z131 Encounter for screening for diabetes mellitus: Secondary | ICD-10-CM | POA: Diagnosis not present

## 2021-09-27 DIAGNOSIS — R7989 Other specified abnormal findings of blood chemistry: Secondary | ICD-10-CM | POA: Diagnosis not present

## 2021-09-27 NOTE — Telephone Encounter (Signed)
Spoke with patient.  States labs are due by tomorrow.  Informed patient if she had them drawn this afternoon, they should be resulted by tomorrow.  Must be fasting for at least 6 hours in order to have glucose drawn as fasting glucose is requested on Physician form.  Pt states she last ate at 7:30 and will go later this afternoon.  Orders placed per orders from Cyril Mourning, NP

## 2021-09-28 LAB — LIPID PANEL
Chol/HDL Ratio: 3.8 ratio (ref 0.0–4.4)
Cholesterol, Total: 149 mg/dL (ref 100–199)
HDL: 39 mg/dL — ABNORMAL LOW (ref >39)
LDL Chol Calc (NIH): 89 mg/dL (ref 0–99)
Triglycerides: 115 mg/dL (ref 0–149)
VLDL Cholesterol Cal: 21 mg/dL (ref 5–40)

## 2021-09-28 LAB — COMPREHENSIVE METABOLIC PANEL
ALT: 22 IU/L (ref 0–32)
AST: 22 IU/L (ref 0–40)
Albumin/Globulin Ratio: 1.7 (ref 1.2–2.2)
Albumin: 4.6 g/dL (ref 3.8–4.8)
Alkaline Phosphatase: 72 IU/L (ref 44–121)
BUN/Creatinine Ratio: 14 (ref 9–23)
BUN: 15 mg/dL (ref 6–24)
Bilirubin Total: 0.4 mg/dL (ref 0.0–1.2)
CO2: 21 mmol/L (ref 20–29)
Calcium: 9.8 mg/dL (ref 8.7–10.2)
Chloride: 102 mmol/L (ref 96–106)
Creatinine, Ser: 1.1 mg/dL — ABNORMAL HIGH (ref 0.57–1.00)
Globulin, Total: 2.7 g/dL (ref 1.5–4.5)
Glucose, Plasma: 103 mg/dL — ABNORMAL HIGH (ref 70–99)
Potassium: 4.2 mmol/L (ref 3.5–5.2)
Sodium: 139 mmol/L (ref 134–144)
Total Protein: 7.3 g/dL (ref 6.0–8.5)
eGFR: 64 mL/min/{1.73_m2} (ref >59)

## 2021-11-12 DIAGNOSIS — N1831 Chronic kidney disease, stage 3a: Secondary | ICD-10-CM | POA: Diagnosis not present

## 2021-11-12 DIAGNOSIS — E6609 Other obesity due to excess calories: Secondary | ICD-10-CM | POA: Diagnosis not present

## 2021-11-12 DIAGNOSIS — I129 Hypertensive chronic kidney disease with stage 1 through stage 4 chronic kidney disease, or unspecified chronic kidney disease: Secondary | ICD-10-CM | POA: Diagnosis not present

## 2021-11-12 DIAGNOSIS — E559 Vitamin D deficiency, unspecified: Secondary | ICD-10-CM | POA: Diagnosis not present

## 2021-11-14 DIAGNOSIS — N182 Chronic kidney disease, stage 2 (mild): Secondary | ICD-10-CM | POA: Diagnosis not present

## 2021-11-14 DIAGNOSIS — E559 Vitamin D deficiency, unspecified: Secondary | ICD-10-CM | POA: Diagnosis not present

## 2021-11-14 DIAGNOSIS — E6609 Other obesity due to excess calories: Secondary | ICD-10-CM | POA: Diagnosis not present

## 2021-11-14 DIAGNOSIS — I129 Hypertensive chronic kidney disease with stage 1 through stage 4 chronic kidney disease, or unspecified chronic kidney disease: Secondary | ICD-10-CM | POA: Diagnosis not present

## 2021-11-15 ENCOUNTER — Other Ambulatory Visit (HOSPITAL_COMMUNITY)
Admission: RE | Admit: 2021-11-15 | Discharge: 2021-11-15 | Disposition: A | Discharge status: Home / Self Care | Payer: BC Managed Care – PPO | Source: Ambulatory Visit | Attending: Adult Health | Admitting: Adult Health

## 2021-11-15 ENCOUNTER — Other Ambulatory Visit: Payer: Self-pay

## 2021-11-15 ENCOUNTER — Ambulatory Visit (INDEPENDENT_AMBULATORY_CARE_PROVIDER_SITE_OTHER): Payer: BC Managed Care – PPO | Admitting: Adult Health

## 2021-11-15 ENCOUNTER — Encounter: Payer: Self-pay | Admitting: Adult Health

## 2021-11-15 VITALS — BP 128/87 | HR 79 | Ht 64.0 in | Wt 196.6 lb

## 2021-11-15 DIAGNOSIS — Z01419 Encounter for gynecological examination (general) (routine) without abnormal findings: Secondary | ICD-10-CM | POA: Diagnosis not present

## 2021-11-15 DIAGNOSIS — R87810 Cervical high risk human papillomavirus (HPV) DNA test positive: Secondary | ICD-10-CM | POA: Diagnosis not present

## 2021-11-15 DIAGNOSIS — I10 Essential (primary) hypertension: Secondary | ICD-10-CM

## 2021-11-15 DIAGNOSIS — Z1211 Encounter for screening for malignant neoplasm of colon: Secondary | ICD-10-CM

## 2021-11-15 DIAGNOSIS — Z3042 Encounter for surveillance of injectable contraceptive: Secondary | ICD-10-CM

## 2021-11-15 DIAGNOSIS — Z1231 Encounter for screening mammogram for malignant neoplasm of breast: Secondary | ICD-10-CM

## 2021-11-15 LAB — HEMOCCULT GUIAC POC 1CARD (OFFICE): Fecal Occult Blood, POC: NEGATIVE

## 2021-11-15 MED ORDER — MEDROXYPROGESTERONE ACETATE 150 MG/ML IM SUSP: INTRAMUSCULAR | 4 refills | Status: DC

## 2021-11-15 MED ORDER — LISINOPRIL-HYDROCHLOROTHIAZIDE 20-25 MG PO TABS: 1.0000 | ORAL_TABLET | Freq: Every day | ORAL | 3 refills | Status: DC

## 2021-11-15 NOTE — Progress Notes (Signed)
Patient ID: Jennifer Wyatt, female   DOB: Sep 30, 1976, 45 y.o.   MRN: [id number] History of Present Illness: Jennifer Wyatt is a 45 year old black female, married, G2P2002, in for well woman gyn exam and pap. She works third shift at [employer].   Current Medications, Allergies, Past Medical History, Past Surgical History, Family History and Social History were reviewed in [employer] record.     Review of Systems: Patient denies any headaches, hearing loss, fatigue, blurred vision, shortness of breath, chest pain, abdominal pain, problems with bowel movements, urination, or intercourse. No joint pain or mood swings.     Physical Exam:BP 128/87 (BP Location: Right Arm, Patient Position: Sitting, Cuff Size: Normal)   Pulse 79   Ht 5\' 4"  (1.626 m)   Wt 196 lb 9.6 oz (89.2 kg)   BMI 33.75 kg/m   General:  Well developed, well nourished, no acute distress Skin:  Warm and dry Neck:  Midline trachea, normal thyroid, good ROM, no lymphadenopathy Lungs; Clear to auscultation bilaterally Breast:  No dominant palpable mass, retraction, or nipple discharge Cardiovascular: Regular rate and rhythm Abdomen:  Soft, non tender, no hepatosplenomegaly Pelvic:  External genitalia is normal in appearance, no lesions.  The vagina is normal in appearance. Urethra has no lesions or masses. The cervix is bulbous.Pap with HR HPV genotyping performed.   Uterus is felt to be normal size, shape, and contour.  No adnexal masses or tenderness noted.Bladder is non tender, no masses felt. Rectal: Good sphincter tone, no polyps, or hemorrhoids felt.  Hemoccult negative. Extremities/musculoskeletal:  No swelling or varicosities noted, no clubbing or cyanosis Psych:  No mood changes, alert and cooperative,seems happy AA is 2 Fall risk is low Depression screen [facility] 2/9 11/15/2021 11/02/2020 10/25/2019  Decreased Interest 1 1 2   Down, Depressed, Hopeless 1 1 1   PHQ - 2 Score 2 2 3   Altered sleeping 1 1  0  Tired, decreased energy 1 1 0  Change in appetite 1 1 0  Feeling bad or failure about yourself  0 0 0  Trouble concentrating 0 0 0  Moving slowly or fidgety/restless 0 0 0  Suicidal thoughts 0 0 0  PHQ-9 Score 5 5 3   Difficult doing work/chores - - -    GAD 7 : Generalized Anxiety Score 11/15/2021 11/02/2020  Nervous, Anxious, on Edge 1 1  Control/stop worrying 1 1  Worry too much - different things 1 1  Trouble relaxing 1 1  Restless 0 0  Easily annoyed or irritable 1 1  Afraid - awful might happen 0 0  Total GAD 7 Score 5 5      Upstream - 11/15/21 0853       Pregnancy Intention Screening   Does the patient want to become pregnant in the next year? No    Does the patient's partner want to become pregnant in the next year? No    Would the patient like to discuss contraceptive options today? No      Contraception Wrap Up   Current Method Hormonal Injection    End Method Hormonal Injection    Contraception Counseling Provided No            Examination chaperoned by LPN   Impression and Plan: 1. Encounter for gynecological examination with Papanicolaou smear of cervix Pap sent Physical in 1 year Pap in 3 if normal - Cytology - PAP( [place])  2. Essential hypertension Continue zestorectic,will refill   Meds ordered  this encounter  Medications   lisinopril-hydrochlorothiazide (ZESTORETIC) 20-25 MG tablet    Sig: Take 1 tablet by mouth daily.    Dispense:  90 tablet    Refill:  3    Order Specific Question:   Supervising Provider    Answer:   Duane Lope H [[id number]]   medroxyPROGESTERone (DEPO-PROVERA) 150 MG/ML injection    Sig: Inject 1 cc every 12 weeks in office    Dispense:  1 mL    Refill:  4    Order Specific Question:   Supervising Provider    Answer:   Despina Hidden, LUTHER H [[id number]]     3. Cervical high risk HPV (human papillomavirus) test positive Pap sent  4. Encounter for screening fecal occult blood testing  - POCT occult blood  stool  5. Screening mammogram for breast cancer Mammogram scheduled for her 12/05/21 at 8:45 am at [facility] - MM 3D SCREEN BREAST BILATERAL; Future  6. Encounter for surveillance of injectable contraceptive Refilled depo, due 12/11/21

## 2021-11-26 LAB — CYTOLOGY - PAP
Comment: NEGATIVE
Diagnosis: NEGATIVE
Diagnosis: REACTIVE
High risk HPV: NEGATIVE

## 2021-12-05 ENCOUNTER — Ambulatory Visit (HOSPITAL_COMMUNITY)
Admission: RE | Admit: 2021-12-05 | Discharge: 2021-12-05 | Disposition: A | Discharge status: Home / Self Care | Payer: BC Managed Care – PPO | Source: Ambulatory Visit | Attending: Adult Health | Admitting: Adult Health

## 2021-12-05 ENCOUNTER — Other Ambulatory Visit: Payer: Self-pay

## 2021-12-05 DIAGNOSIS — Z1231 Encounter for screening mammogram for malignant neoplasm of breast: Secondary | ICD-10-CM | POA: Insufficient documentation

## 2021-12-11 ENCOUNTER — Ambulatory Visit: Payer: BC Managed Care – PPO

## 2021-12-12 ENCOUNTER — Ambulatory Visit (INDEPENDENT_AMBULATORY_CARE_PROVIDER_SITE_OTHER): Payer: BC Managed Care – PPO | Admitting: *Deleted

## 2021-12-12 ENCOUNTER — Other Ambulatory Visit: Payer: Self-pay

## 2021-12-12 DIAGNOSIS — Z3042 Encounter for surveillance of injectable contraceptive: Secondary | ICD-10-CM

## 2021-12-12 MED ORDER — MEDROXYPROGESTERONE ACETATE 150 MG/ML IM SUSP
150.0000 mg | Freq: Once | INTRAMUSCULAR | Status: AC
  Administered 2021-12-12: 09:00:00 150 mg via INTRAMUSCULAR

## 2021-12-12 NOTE — Progress Notes (Signed)
° °  NURSE VISIT- INJECTION  SUBJECTIVE:  Jennifer Wyatt is a 45 y.o. G52P2002 female here for a Depo Provera for contraception/period management. She is a GYN patient.   OBJECTIVE:  There were no vitals taken for this visit.  Appears well, in no apparent distress  Injection administered in: Left upper quad. gluteus  Meds ordered this encounter  Medications   medroxyPROGESTERone (DEPO-PROVERA) injection 150 mg    ASSESSMENT: GYN patient Depo Provera for contraception/period management PLAN: Follow-up: in 11-13 weeks for next Depo   [name]  12/12/2021 8:43 AM

## 2022-01-27 ENCOUNTER — Other Ambulatory Visit: Payer: Self-pay | Admitting: Women's Health

## 2022-03-04 ENCOUNTER — Other Ambulatory Visit: Payer: Self-pay

## 2022-03-04 ENCOUNTER — Ambulatory Visit (INDEPENDENT_AMBULATORY_CARE_PROVIDER_SITE_OTHER): Payer: BC Managed Care – PPO | Admitting: *Deleted

## 2022-03-04 DIAGNOSIS — Z3042 Encounter for surveillance of injectable contraceptive: Secondary | ICD-10-CM

## 2022-03-04 MED ORDER — MEDROXYPROGESTERONE ACETATE 150 MG/ML IM SUSP
150.0000 mg | Freq: Once | INTRAMUSCULAR | Status: AC
  Administered 2022-03-04: 150 mg via INTRAMUSCULAR

## 2022-03-04 NOTE — Progress Notes (Addendum)
? ?  NURSE VISIT- INJECTION ? ?SUBJECTIVE:  ?Jennifer Wyatt is a 46 y.o. G18P2002 female here for a Depo Provera for contraception/period management. She is a GYN patient.  ? ?OBJECTIVE:  ?There were no vitals taken for this visit.  ?Appears well, in no apparent distress ? ?Injection administered in: Right upper quad. gluteus ? ?Meds ordered this encounter  ?Medications  ? medroxyPROGESTERone (DEPO-PROVERA) injection 150 mg  ? ? ?ASSESSMENT: ?GYN patient Depo Provera for contraception/period management ?PLAN: ?Follow-up: in 11-13 weeks for next Depo  ? ?Levy Pupa  ?03/04/2022 ?9:19 AM ? ? ?Attestation of Attending Supervision of Nursing Visit Encounter: Evaluation and management procedures were performed by the nursing staff under my supervision and collaboration.  I have reviewed the nurse's note and chart, and I agree with the management and plan. ? ?Jacelyn Grip MD ?Attending Physician for the Center for [facility] ?03/04/2022 ?2:32 PM ? ?

## 2022-05-13 DIAGNOSIS — N182 Chronic kidney disease, stage 2 (mild): Secondary | ICD-10-CM | POA: Diagnosis not present

## 2022-05-13 DIAGNOSIS — E559 Vitamin D deficiency, unspecified: Secondary | ICD-10-CM | POA: Diagnosis not present

## 2022-05-13 DIAGNOSIS — R7303 Prediabetes: Secondary | ICD-10-CM | POA: Diagnosis not present

## 2022-05-13 DIAGNOSIS — I129 Hypertensive chronic kidney disease with stage 1 through stage 4 chronic kidney disease, or unspecified chronic kidney disease: Secondary | ICD-10-CM | POA: Diagnosis not present

## 2022-05-13 DIAGNOSIS — E6609 Other obesity due to excess calories: Secondary | ICD-10-CM | POA: Diagnosis not present

## 2022-05-15 DIAGNOSIS — I129 Hypertensive chronic kidney disease with stage 1 through stage 4 chronic kidney disease, or unspecified chronic kidney disease: Secondary | ICD-10-CM | POA: Diagnosis not present

## 2022-05-15 DIAGNOSIS — R7303 Prediabetes: Secondary | ICD-10-CM | POA: Diagnosis not present

## 2022-05-15 DIAGNOSIS — E6609 Other obesity due to excess calories: Secondary | ICD-10-CM | POA: Diagnosis not present

## 2022-05-15 DIAGNOSIS — N182 Chronic kidney disease, stage 2 (mild): Secondary | ICD-10-CM | POA: Diagnosis not present

## 2022-05-23 ENCOUNTER — Ambulatory Visit (INDEPENDENT_AMBULATORY_CARE_PROVIDER_SITE_OTHER): Payer: BC Managed Care – PPO | Admitting: *Deleted

## 2022-05-23 DIAGNOSIS — Z3042 Encounter for surveillance of injectable contraceptive: Secondary | ICD-10-CM | POA: Diagnosis not present

## 2022-05-23 MED ORDER — MEDROXYPROGESTERONE ACETATE 150 MG/ML IM SUSP
150.0000 mg | Freq: Once | INTRAMUSCULAR | Status: AC
  Administered 2022-05-23: 150 mg via INTRAMUSCULAR

## 2022-05-23 NOTE — Progress Notes (Signed)
   NURSE VISIT- INJECTION  SUBJECTIVE:  Jennifer Wyatt is a 46 y.o. G24P2002 female here for a Depo Provera for contraception/period management. She is a GYN patient.   OBJECTIVE:  There were no vitals taken for this visit.  Appears well, in no apparent distress  Injection administered in: Left upper quad. gluteus  Meds ordered this encounter  Medications   medroxyPROGESTERone (DEPO-PROVERA) injection 150 mg    ASSESSMENT: GYN patient Depo Provera for contraception/period management PLAN: Follow-up: in 11-13 weeks for next Depo   [name]  05/23/2022 8:45 AM

## 2022-05-24 ENCOUNTER — Emergency Department (HOSPITAL_COMMUNITY): Payer: BC Managed Care – PPO

## 2022-05-24 ENCOUNTER — Encounter (HOSPITAL_COMMUNITY): Payer: Self-pay

## 2022-05-24 ENCOUNTER — Emergency Department (HOSPITAL_COMMUNITY)
Admission: EM | Admit: 2022-05-24 | Discharge: 2022-05-24 | Disposition: A | Discharge status: Home / Self Care | Payer: BC Managed Care – PPO | Attending: Emergency Medicine | Admitting: Emergency Medicine

## 2022-05-24 ENCOUNTER — Other Ambulatory Visit: Payer: Self-pay

## 2022-05-24 DIAGNOSIS — N189 Chronic kidney disease, unspecified: Secondary | ICD-10-CM | POA: Insufficient documentation

## 2022-05-24 DIAGNOSIS — R55 Syncope and collapse: Secondary | ICD-10-CM | POA: Diagnosis not present

## 2022-05-24 DIAGNOSIS — R252 Cramp and spasm: Secondary | ICD-10-CM | POA: Diagnosis not present

## 2022-05-24 DIAGNOSIS — R569 Unspecified convulsions: Secondary | ICD-10-CM | POA: Insufficient documentation

## 2022-05-24 DIAGNOSIS — I129 Hypertensive chronic kidney disease with stage 1 through stage 4 chronic kidney disease, or unspecified chronic kidney disease: Secondary | ICD-10-CM | POA: Insufficient documentation

## 2022-05-24 DIAGNOSIS — Z79899 Other long term (current) drug therapy: Secondary | ICD-10-CM | POA: Insufficient documentation

## 2022-05-24 DIAGNOSIS — R251 Tremor, unspecified: Secondary | ICD-10-CM | POA: Diagnosis not present

## 2022-05-24 LAB — MAGNESIUM: Magnesium: 2.3 mg/dL (ref 1.7–2.4)

## 2022-05-24 LAB — BASIC METABOLIC PANEL
Anion gap: 7 (ref 5–15)
BUN: 18 mg/dL (ref 6–20)
CO2: 24 mmol/L (ref 22–32)
Calcium: 9.6 mg/dL (ref 8.9–10.3)
Chloride: 105 mmol/L (ref 98–111)
Creatinine, Ser: 0.95 mg/dL (ref 0.44–1.00)
GFR, Estimated: >60 mL/min (ref >60)
Glucose, Bld: 113 mg/dL — ABNORMAL HIGH (ref 70–99)
Potassium: 3.8 mmol/L (ref 3.5–5.1)
Sodium: 136 mmol/L (ref 135–145)

## 2022-05-24 LAB — CBC WITH DIFFERENTIAL/PLATELET
Abs Immature Granulocytes: 0.07 10*3/uL (ref 0.00–0.07)
Basophils Absolute: 0.1 10*3/uL (ref 0.0–0.1)
Basophils Relative: 0 %
Eosinophils Absolute: 0.1 10*3/uL (ref 0.0–0.5)
Eosinophils Relative: 1 %
HCT: 40.6 % (ref 36.0–46.0)
Hemoglobin: 14 g/dL (ref 12.0–15.0)
Immature Granulocytes: 1 %
Lymphocytes Relative: 13 %
Lymphs Abs: 1.6 10*3/uL (ref 0.7–4.0)
MCH: 32.1 pg (ref 26.0–34.0)
MCHC: 34.5 g/dL (ref 30.0–36.0)
MCV: 93.1 fL (ref 80.0–100.0)
Monocytes Absolute: 0.7 10*3/uL (ref 0.1–1.0)
Monocytes Relative: 6 %
Neutro Abs: 9.9 10*3/uL — ABNORMAL HIGH (ref 1.7–7.7)
Neutrophils Relative %: 79 %
Platelets: 248 10*3/uL (ref 150–400)
RBC: 4.36 MIL/uL (ref 3.87–5.11)
RDW: 12 % (ref 11.5–15.5)
WBC: 12.4 10*3/uL — ABNORMAL HIGH (ref 4.0–10.5)
nRBC: 0 % (ref 0.0–0.2)

## 2022-05-24 LAB — LACTIC ACID, PLASMA
Lactic Acid, Venous: 1 mmol/L (ref 0.5–1.9)
Lactic Acid, Venous: 0.7 mmol/L (ref 0.5–1.9)

## 2022-05-24 LAB — CK: Total CK: 331 U/L — ABNORMAL HIGH (ref 38–234)

## 2022-05-24 LAB — HCG, QUANTITATIVE, PREGNANCY: hCG, Beta Chain, Quant, S: <1 m[IU]/mL (ref <5)

## 2022-05-24 NOTE — Discharge Instructions (Addendum)
You have been provided contact information for [facility] neurology.  I have also placed a referral on your behalf.  You should receive a phone call within the next 72 hours to schedule an appointment within the next week.  If you have not been contacted by this time, you may contact them to schedule an appointment.  You must refrain from driving any vehicles or operating heavy machinery until you are cleared by neurology.  Also consider follow-up with your nephrologist to reevaluate the blood pressure medications as discussed.  Return to the ED for new or worsening symptoms as discussed.   SEIZURE PRECAUTIONS Per [facility] statutes, patients with seizures are not allowed to drive until they have been seizure-free for six months.    Use caution when using heavy equipment or power tools. Avoid working on ladders or at heights. Take showers instead of baths. Ensure the water temperature is not too high on the home water heater. Do not go swimming alone. Do not lock yourself in a room alone (i.e. bathroom). When caring for infants or small children, sit down when holding, feeding, or changing them to minimize risk of injury to the child in the event you have a seizure. Maintain good sleep hygiene. Avoid alcohol.    If the patient has another seizure, call 911 and bring them back to the ED if: A.  The seizure lasts longer than 5 minutes.      B.  The patient doesn't wake shortly after the seizure or has new problems such as difficulty seeing, speaking or moving following the seizure C.  The patient was injured during the seizure D.  The patient has a temperature over 102 F (39C) E.  The patient vomited during the seizure and now is having trouble breathing

## 2022-05-24 NOTE — ED Provider Notes (Signed)
[facility] EMERGENCY DEPARTMENT Provider Note   CSN: [id number] Arrival date & time: 05/24/22  1304     History {Add pertinent medical, surgical, social history, OB history to HPI:1} Chief Complaint  Patient presents with   Loss of Consciousness   Possible Seizure    Jennifer Wyatt is a 46 y.o. female with chief complaint of possible first-time seizure.  Patient noticed a leg cramp in her left leg, bent down to try to "smooth it out", then fell to the ground and began "shaking and seizing".  Patient has no recollection of what happened after she bent down to attend to her leg, and then felt warm and sweaty when she came to.  Patient's significant other witnessed her fall and described her as having seizure-like activity.  No known personal or family history of seizures.  Significant other described patient as being in a state of confusion for the next 10 to 30 minutes.  Patient states she does not feel groggy, and feels back to baseline at this present time.  Recently started a new blood pressure medication 6 days ago.  Has been on lisinopril, recently had amlodipine 2.5 mg added.    The history is provided by the patient and medical records.  Loss of Consciousness     Home Medications Prior to Admission medications   Medication Sig Start Date End Date Taking? Authorizing Provider  lisinopril-hydrochlorothiazide (ZESTORETIC) 20-25 MG tablet Take 1 tablet by mouth daily. 11/15/21   Estill Dooms, NP  medroxyPROGESTERone (DEPO-PROVERA) 150 MG/ML injection Inject 1 cc every 12 weeks in office 11/15/21   Derrek Monaco A, NP  VITAMIN D PO Take 1 tablet by mouth daily.    [provider]      Allergies    Patient has no known allergies.    Review of Systems   Review of Systems  Cardiovascular:  Positive for syncope.   Physical Exam Updated Vital Signs BP (!) 156/75 (BP Location: Right Arm)   Pulse 72   Temp 98 F (36.7 C) (Oral)   Resp 20   Ht 5\' 4"   (1.626 m)   Wt 89.4 kg   SpO2 100%   BMI 33.81 kg/m  Physical Exam  ED Results / Procedures / Treatments   Labs (all labs ordered are listed, but only abnormal results are displayed) Labs Reviewed - No data to display  EKG None  Radiology No results found.  Procedures Procedures  {Document cardiac monitor, telemetry assessment procedure when appropriate:1}  Medications Ordered in ED Medications - No data to display  ED Course/ Medical Decision Making/ A&P                           Medical Decision Making Amount and/or Complexity of Data Reviewed External Data Reviewed: notes. Labs: ordered. Decision-making details documented in ED Course. Radiology: ordered and independent interpretation performed. Decision-making details documented in ED Course. ECG/medicine tests: ordered and independent interpretation performed. Decision-making details documented in ED Course.  Risk OTC drugs. Prescription drug management.   46 y.o. female presents to the ED for concern of Loss of Consciousness and Possible Seizure   This involves an extensive number of treatment options, and is a complaint that carries with it a high risk of complications and morbidity.  The emergent differential diagnosis prior to evaluation includes, but is not limited to: Syncope with clonic like activity, seizure, encephalopathy, intracranial hemorrhage, arrhythmia, psychogenic  This is not an exhaustive  differential.   Past Medical History / Co-morbidities / Social History: Hx of CKD, HTN, prediabetes,  Social Determinants of Health include prediabetes, for which lifestyle changes were emphasized  Additional History:  Internal and external records from outside source obtained and reviewed including nephrology  Physical Exam: Physical exam performed. The pertinent findings include: Overall completely unremarkable physical exam  Lab Tests: I ordered, and personally interpreted labs.  The pertinent  results include:   CBC: Very mildly elevated WBC of 12.4, likely due to stress CMP/BMP: Overall unremarkable Lactic acid: Negative at 1.0 CK: Mildly elevated at 331 Mg: 2.37 Quantitative hCG: ***  Imaging Studies: I ordered imaging studies including CT head .  I independently visualized and interpreted said imaging.  Pertinent results include: Cute intracranial abnormality I agree with the radiologist interpretation.  Medications: I ordered medication including *** for ***.  Reevaluation of the patient after these medicines showed that the patient ***.  I have reviewed the patients home medicines and have made adjustments as needed  ED Course/Disposition: Pt well-appearing on exam.  Complains of possible seizure about 2 hours prior to evaluation.  No Hx of prior seizures or family history of seizures.  CT head negative for acute intracranial abnormalities.  No recent trauma.  Overall completely unremarkable physical and neuro exam as described above.  Patient AAO X4.  EKG without evidence of arrhythmia or prolonged QTc.  Magnesium is normal, CK mildly elevated at 331 which is nonspecific.  Lactate is not elevated, suggesting tonic-clonic seizure is less likely cause of presentation.  But the patient was described by significant other who witnessed the seizure as being "a little out of it" for somewhere between 5-30 minutes.  Unable to narrow down specific duration of subjective grogginess/disorientation.  First-time seizure vs syncope with chronic activity.    After consideration of the diagnostic results and the patient's encounter today, I feel that the emergency department workup does not suggest an emergent condition requiring admission or immediate intervention beyond what has been performed at this time.  The patient is safe for discharge and has been instructed to return immediately for worsening symptoms, change in symptoms or any other concerns.  Discussed course of treatment thoroughly  with the patient, whom demonstrated understanding.  Patient in agreement and has no further questions.  I discussed this case with my attending physician Dr. Melina Copa, who agreed with the proposed treatment course and cosigned this note including patient's presenting symptoms, physical exam, and planned diagnostics and interventions.  Attending physician stated agreement with plan or made changes to plan which were implemented.     This chart was dictated using voice recognition software.  Despite best efforts to proofread, errors can occur which can change the documentation meaning.   {Document critical care time when appropriate:1} {Document review of labs and clinical decision tools ie heart score, Chads2Vasc2 etc:1}  {Document your independent review of radiology images, and any outside records:1} {Document your discussion with family members, caretakers, and with consultants:1} {Document social determinants of health affecting pt's care:1} {Document your decision making why or why not admission, treatments were needed:1} Final Clinical Impression(s) / ED Diagnoses Final diagnoses:  None    Rx / DC Orders ED Discharge Orders     None

## 2022-05-24 NOTE — ED Triage Notes (Signed)
Pt presents after having a syncopal episode and possible seizure.  Pt reports her L upper leg was cramping and sts "that is the last thing I remember."  Significant other sts she was complaining of pain in her leg, sat on the side of the bed, passed out, fell in the floor, and upper body started jerking for 30-45 seconds.  When Pt came to, she was complaining of her legs hurting.  Currently  denies pain.

## 2022-05-28 ENCOUNTER — Encounter: Payer: Self-pay | Admitting: Neurology

## 2022-05-30 ENCOUNTER — Encounter: Payer: Self-pay | Admitting: Neurology

## 2022-05-30 ENCOUNTER — Ambulatory Visit (INDEPENDENT_AMBULATORY_CARE_PROVIDER_SITE_OTHER): Payer: BC Managed Care – PPO | Admitting: Neurology

## 2022-05-30 VITALS — BP 130/84 | HR 98 | Ht 64.0 in | Wt 197.0 lb

## 2022-05-30 DIAGNOSIS — R569 Unspecified convulsions: Secondary | ICD-10-CM

## 2022-05-30 DIAGNOSIS — R55 Syncope and collapse: Secondary | ICD-10-CM | POA: Diagnosis not present

## 2022-05-30 NOTE — Patient Instructions (Addendum)
Schedule MRI brain with and without contrast  2. Schedule EEG  3. Referral will be sent to Cardiology  4. Increase hydration  5. Follow-up in 3 months, call for any changes   Seizure Precautions: 1. If medication has been prescribed for you to prevent seizures, take it exactly as directed.  Do not stop taking the medicine without talking to your doctor first, even if you have not had a seizure in a long time.   2. Avoid activities in which a seizure would cause danger to yourself or to others.  Don't operate dangerous machinery, swim alone, or climb in high or dangerous places, such as on ladders, roofs, or girders.  Do not drive unless your doctor says you may.  3. If you have any warning that you may have a seizure, lay down in a safe place where you can't hurt yourself.    4.  No driving for 6 months from last seizure, as per [facility].   Please refer to the following link on the [place] website for more information: http://[email or web address].org/[email or web address]/driving/drivingu.cfm   5.  Maintain good sleep hygiene. Avoid alcohol.  6.  Notify your neurology if you are planning pregnancy or if you become pregnant.  7.  Contact your doctor if you have any problems that may be related to the medicine you are taking.  8.  Call 911 and bring the patient back to the ED if:        A.  The seizure lasts longer than 5 minutes.       B.  The patient doesn't awaken shortly after the seizure  C.  The patient has new problems such as difficulty seeing, speaking or moving  D.  The patient was injured during the seizure  E.  The patient has a temperature over 102 F (39C)  F.  The patient vomited and now is having trouble breathing

## 2022-05-30 NOTE — Progress Notes (Unsigned)
NEUROLOGY CONSULTATION NOTE  [name] MRN: [name] DOB: 30-Jun-1976  Referring provider: Kathie DikeAlicia Cockerham, PA-C Primary care provider: none listed  Reason for consult:  seizure  Thank you for your kind referral of Jennifer Wyatt for consultation of the above symptoms. Although Jennifer Wyatt history is well known to you, please allow me to reiterate it for the purpose of our medical record. The Jennifer Wyatt was accompanied to the clinic by Jennifer Wyatt husband [name] who also provides collateral information. Records and images were personally reviewed where available.   HISTORY OF PRESENT ILLNESS: This is a pleasant 46 year old right-handed woman with a history of hypertension presenting for evaluation of seizure-like activity. Jennifer Wyatt was in Jennifer Wyatt usual state of health until 05/24/22, Jennifer Wyatt came home from working third shift and took a nap. Jennifer Wyatt woke up because Jennifer Wyatt left foot was itching, Jennifer Wyatt lifted Jennifer Wyatt leg and it cramped up. Jennifer Wyatt tried to stretch I, but a shock then went all the way up Jennifer Wyatt body and Jennifer Wyatt felt lightheaded. Jennifer Wyatt then woke up on the floor, diaphoretic. Jennifer Wyatt husband reports that he saw Jennifer Wyatt sitting up on the side of the bed then Jennifer Wyatt fell on the floor. After a couple of minutes, Jennifer Wyatt started shaking for 10-20 seconds. Jennifer Wyatt eyes were open, Jennifer Wyatt lay for a while then got up and sat herself on the side of the bed. He recalls Jennifer Wyatt was talking and not confused after. No tongue bite or incontinence. Jennifer Wyatt was brought to the ER where vital signs were normal, EKG NSR. I personally reviewed head CT which did not show any acute changes. Lactic acid was normal, Jennifer Wyatt CK level was 331 (ref [id number]). WBC 12.4. Jennifer Wyatt reported starting Amlodipine 6 days prior, otherwise no other new medications. No alcohol intake. Jennifer Wyatt recalls an episode of loss of consciousness a couple of years ago, Jennifer Wyatt was sitting on the couch and started feeling lightheaded. Jennifer Wyatt got up to use the bathroom, recalls setting herself on the floor and passing out, waking  up diaphoretic. Jennifer Wyatt did not seek medical assistance that time.   Jennifer Wyatt has had episodes of left arm tightness and left leg numbness that occur independently. Jennifer Wyatt husband denies any staring/unresponsiveness, Jennifer Wyatt denies any other gaps in time, myoclonic jerks. Since starting Jennifer Wyatt BP medication, Jennifer Wyatt has noticed a metallic taste when swallowing. Jennifer Wyatt feels woozy/lightheaded even when sitting, "like I am off balance." Sometimes there is a certain position when Jennifer Wyatt stands and feels the sensation for a few minutes. No headaches, nausea/vomiting, diplopia, dysarthria/dysphagia, neck/back pain, bowel/bladder dysfunction. Jennifer Wyatt had a normal birth and early development.  There is no history of febrile convulsions, CNS infections such as meningitis/encephalitis, significant traumatic brain injury, neurosurgical procedures, or family history of seizures.    PAST MEDICAL HISTORY: Past Medical History:  Diagnosis Date   Hypertension    Varicosities     PAST SURGICAL HISTORY: History reviewed. No pertinent surgical history.  MEDICATIONS: Current Outpatient Medications on File Prior to Visit  Medication Sig Dispense Refill   AMLODIPINE BENZOATE PO Take 2.5 mg by mouth daily.     lisinopril-hydrochlorothiazide (ZESTORETIC) 20-25 MG tablet Take 1 tablet by mouth daily. 90 tablet 3   medroxyPROGESTERone (DEPO-PROVERA) 150 MG/ML injection Inject 1 cc every 12 weeks in office 1 mL 4   VITAMIN D PO Take 1 tablet by mouth daily. For 8 days 1 x weekly 50,000 units     No current facility-administered medications on file prior to visit.    ALLERGIES: No Known Allergies  FAMILY HISTORY: Family History  Problem Relation Age of Onset   Thyroid disease Mother    Asthma Father    Hypertension Father    Diabetes Maternal Aunt    Kidney failure Maternal Aunt     SOCIAL HISTORY: Social History   Socioeconomic History   Marital status: Married    Spouse name: Not on file   Number of children: 2   Years of  education: Not on file   Highest education level: Not on file  Occupational History   Not on file  Tobacco Use   Smoking status: Never   Smokeless tobacco: Never  Vaping Use   Vaping Use: Never used  Substance and Sexual Activity   Alcohol use: Yes    Comment: occ.   Drug use: No   Sexual activity: Yes    Partners: Male    Birth control/protection: Injection  Other Topics Concern   Not on file  Social History Narrative   Right handed   Lives with husband   Two story home   Social Determinants of Health   Financial Resource Strain: Low Risk    Difficulty of Paying Living Expenses: Not hard at all  Food Insecurity: No Food Insecurity   Worried About Programme researcher, broadcasting/film/video in the Last Year: Never true   Barista in the Last Year: Never true  Transportation Needs: No Transportation Needs   Lack of Transportation (Medical): No   Lack of Transportation (Non-Medical): No  Physical Activity: Sufficiently Active   Days of Exercise per Week: 5 days   Minutes of Exercise per Session: 100 min  Stress: Stress Concern Present   Feeling of Stress : To some extent  Social Connections: Unknown   Frequency of Communication with Friends and Family: More than three times a week   Frequency of Social Gatherings with Friends and Family: More than three times a week   Attends Religious Services: Jennifer Wyatt refused   Database administrator or Organizations: No   Attends Engineer, structural: Never   Marital Status: Married  Catering manager Violence: Not At Risk   Fear of Current or Ex-Partner: No   Emotionally Abused: No   Physically Abused: No   Sexually Abused: No     PHYSICAL EXAM: Vitals:   05/30/22 0900  BP: 130/84  Pulse: 98  SpO2: 97%   General: No acute distress Head:  Normocephalic/atraumatic Skin/Extremities: No rash, no edema Neurological Exam: Mental status: alert and oriented to person, place, and time, no dysarthria or aphasia, Fund of knowledge is  appropriate.  Recent and remote memory are intact.  Attention and concentration are normal.    Able to name objects and repeat phrases. Cranial nerves: CN I: not tested CN II: pupils equal, round and reactive to light, visual fields intact CN III, IV, VI:  full range of motion, no nystagmus, no ptosis CN V: facial sensation intact CN VII: upper and lower face symmetric CN VIII: hearing intact to conversation Bulk & Tone: normal, no fasciculations. Motor: 5/5 throughout with no pronator drift. Sensation: intact to light touch, cold, pin, vibration sense.  No extinction to double simultaneous stimulation.  Romberg test negative Deep Tendon Reflexes: +2 throughout Cerebellar: no incoordination on finger to nose testing Gait: narrow-based and steady, able to tandem walk adequately. Tremor: none   IMPRESSION: This is a pleasant 46 year old right-handed woman with a history of hypertension presenting for evaluation of seizure-like activity. Jennifer Wyatt neurological exam is normal,  no clear epilepsy risk factors. Jennifer Wyatt husband reports quick return to baseline. Symptoms suggestive of convulsive syncope, less likely seizure. Jennifer Wyatt also reports another episode of loss of consciousness a couple of years ago. From a neurological standpoint, MRI brain with and without contrast and routine EEG will be ordered. Jennifer Wyatt will be referred to Cardiology for syncope. Jennifer Wyatt was advised to increase fluid intake. [place] driving laws were discussed with the Jennifer Wyatt, and Jennifer Wyatt knows to stop driving after an episode of loss of consciousness until 6 months event-free. Follow-up in 3 months, call for any changes.   Thank you for allowing me to participate in the care of this Jennifer Wyatt. Please do not hesitate to call for any questions or concerns.   Patrcia Dolly, M.D.  CC: Kathie Dike, PA-C

## 2022-06-11 ENCOUNTER — Ambulatory Visit: Payer: BC Managed Care – PPO | Admitting: Neurology

## 2022-06-17 NOTE — Progress Notes (Signed)
Cardiology Office Note:    Date:  06/20/2022   ID:  Jennifer Wyatt, DOB February 26, 1976, MRN [id number]  PCP:  Pcp, No   [facility] Providers Cardiologist:  None {   Referring MD: Van Clines, MD    History of Present Illness:    Jennifer Wyatt is a 46 y.o. female with a hx of HTN who was referred by Dr. Karel Jarvis for syncope.  Patient was seen by Dr. Karel Jarvis from Neurology on 05/30/22 for evaluation for possible seizure. Note reviewed. Patient was doing well until 05/24/22 where she had come home from work and took a nap. She woke up because her left leg was itching. She lifted her leg and it cramped and a shock went up her body and she felt lightheaded. She they woke up on the floor and was diaphoretic. She then began shaking for 10-20seconds. She was then brought to the ER for further evaluation.   In the ER, vitals normal. ECG with NSR with no ischemic changes. CT head without pathology. She was scheduled for follow-up with neuro who recommended MRI head, EEG and cardiology evaluation due to concern for convulsive syncope.  Today, the patient overall feels okay today. About one month ago the patient woke up from sleep sweating. She got up and sat on the side of her bed and got a leg cramp and the pain traveled up her leg to the rest of er body and she suddenly lost consciousness. She hit the side of her face on bed stand. Was out for about 30 seconds. She remembers coming to and then she started shaking for about 10 seconds and then she came back to again. She was evaluated in the ER as detailed above.  Since this episode, the patient has had no further episodes of syncope. No chest pain, SOB, palpitations, orthopnea or PND. Has trace LE edema from standing but this is unchanged. Notably, 2 weeks prior to her syncope, she was started on amlodipine. This was the only new medication. She denies any recent illnesses or dehydration prior to the event.  Had one episode of syncope in the  past when she felt lightheaded and went to urinate and passed out. This occurred about 2 years ago.   Family history: Father: HTN; mother: hypothyroidism. No known CV disease or seizure disorders in the family.  Past Medical History:  Diagnosis Date   Hypertension    Syncope    Varicosities     No past surgical history on file.  Current Medications: Current Meds  Medication Sig   AMLODIPINE BENZOATE PO Take 2.5 mg by mouth daily.   lisinopril-hydrochlorothiazide (ZESTORETIC) 20-25 MG tablet Take 1 tablet by mouth daily.   medroxyPROGESTERone (DEPO-PROVERA) 150 MG/ML injection Inject 1 cc every 12 weeks in office   VITAMIN D PO Take 1 tablet by mouth daily. For 8 days 1 x weekly 50,000 units     Allergies:   Patient has no known allergies.   Social History   Socioeconomic History   Marital status: Married    Spouse name: Not on file   Number of children: 2   Years of education: Not on file   Highest education level: Not on file  Occupational History   Not on file  Tobacco Use   Smoking status: Never   Smokeless tobacco: Never  Vaping Use   Vaping Use: Never used  Substance and Sexual Activity   Alcohol use: Yes    Comment: occ.   Drug  use: No   Sexual activity: Yes    Partners: Male    Birth control/protection: Injection  Other Topics Concern   Not on file  Social History Narrative   Right handed   Lives with husband   Two story home   Social Determinants of Health   Financial Resource Strain: Low Risk  (11/15/2021)   Overall Financial Resource Strain (CARDIA)    Difficulty of Paying Living Expenses: Not hard at all  Food Insecurity: No Food Insecurity (11/15/2021)   Hunger Vital Sign    Worried About Running Out of Food in the Last Year: Never true    Ran Out of Food in the Last Year: Never true  Transportation Needs: No Transportation Needs (11/15/2021)   PRAPARE - Administrator, Civil Service (Medical): No    Lack of Transportation  (Non-Medical): No  Physical Activity: Sufficiently Active (11/15/2021)   Exercise Vital Sign    Days of Exercise per Week: 5 days    Minutes of Exercise per Session: 100 min  Stress: Stress Concern Present (11/15/2021)   [employer] of Occupational Health - Occupational Stress Questionnaire    Feeling of Stress : To some extent  Social Connections: Unknown (11/15/2021)   Social Connection and Isolation Panel [NHANES]    Frequency of Communication with Friends and Family: More than three times a week    Frequency of Social Gatherings with Friends and Family: More than three times a week    Attends Religious Services: Patient refused    Database administrator or Organizations: No    Attends Banker Meetings: Never    Marital Status: Married     Family History: The patient's family history includes Asthma in her father; Diabetes in her maternal aunt; Hypertension in her father; Kidney failure in her maternal aunt; Thyroid disease in her mother.  ROS:   Please see the history of present illness.     All other systems reviewed and are negative.  EKGs/Labs/Other Studies Reviewed:    The following studies were reviewed today: No CV studies  EKG:  EKG 05/24/22: NSR-personally reviewed  Recent Labs: 09/27/2021: ALT 22 05/24/2022: BUN 18; Creatinine, Ser 0.95; Hemoglobin 14.0; Magnesium 2.3; Platelets 248; Potassium 3.8; Sodium 136  Recent Lipid Panel    Component Value Date/Time   CHOL 149 09/27/2021 1519   TRIG 115 09/27/2021 1519   HDL 39 (L) 09/27/2021 1519   CHOLHDL 3.8 09/27/2021 1519   LDLCALC 89 09/27/2021 1519     Risk Assessment/Calculations:           Physical Exam:    VS:  BP 132/74   Pulse 76   Ht 5\' 4"  (1.626 m)   Wt 200 lb (90.7 kg)   SpO2 97%   BMI 34.33 kg/m     Wt Readings from Last 3 Encounters:  06/20/22 200 lb (90.7 kg)  05/30/22 197 lb (89.4 kg)  05/24/22 197 lb (89.4 kg)     GEN:  Well nourished, well developed in no  acute distress HEENT: Normal NECK: No JVD; No carotid bruits CARDIAC: RRR, soft systolic 1/6 murmur at RUSB RESPIRATORY:  Clear to auscultation without rales, wheezing or rhonchi  ABDOMEN: Soft, non-tender, non-distended MUSCULOSKELETAL:  No edema; No deformity  SKIN: Warm and dry NEUROLOGIC:  Alert and oriented x 3 PSYCHIATRIC:  Normal affect   ASSESSMENT:    1. Syncope, unspecified syncope type   2. Essential hypertension    PLAN:    In  order of problems listed above:  #Syncope: Had episode of LOC with total body shaking for 10-20s as detailed above. ER work-up reassuring with normal ECG and no acute pathology on CT imaging. She saw neuro and was recommended for MRI brain, EEG and Cardiology evaluation due to concern for convulsive syncope. She denies any personal or significant family history of CV disease or arrhythmias. Has one prior episode of syncope which sounds vasovagal in nature. She is overall active without anginal symptoms or palpitations. Unclear etiology of syncope as she reports very little prodromal symptoms although she may have had a possible vagal event triggered by the leg cramp in the setting of increased vagal tone as patient was napping. Does not report any orthostatic symptoms from recent initiation of amlodipine. Arrhythmogenic etiology on the differential. She is currently undergoing Neuro work-up. Will check TTE and 14 day zio for further evaluation -Check TTE -Check 14day zio monitor  #HTN: Stopped taking amlodipine due to episode of syncope. BP 130s in the office. Discussed monitoring at home and if <130/90 on average, okay to stay off of the amlodipine. -Continue lisinopril-HCTZ 20-25mg  daily -Monitor BP at home with goal <130/90      Medication Adjustments/Labs and Tests Ordered: Current medicines are reviewed at length with the patient today.  Concerns regarding medicines are outlined above.  Orders Placed This Encounter  Procedures   LONG TERM  MONITOR (3-14 DAYS)   ECHOCARDIOGRAM COMPLETE   No orders of the defined types were placed in this encounter.   Patient Instructions  Medication Instructions:   Your physician recommends that you continue on your current medications as directed. Please refer to the Current Medication list given to you today.  *If you need a refill on your cardiac medications before your next appointment, please call your pharmacy*   Testing/Procedures:  Your physician has requested that you have an echocardiogram. Echocardiography is a painless test that uses sound waves to create images of your heart. It provides your doctor with information about the size and shape of your heart and how well your heart's chambers and valves are working. This procedure takes approximately one hour. There are no restrictions for this procedure.   ZIO XT- Long Term Monitor Instructions  Your physician has requested you wear a ZIO patch monitor for 14 days.  This is a single patch monitor. [employer] supplies one patch monitor per enrollment. Additional stickers are not available. Please do not apply patch if you will be having a Nuclear Stress Test,  Echocardiogram, Cardiac CT, MRI, or Chest Xray during the period you would be wearing the  monitor. The patch cannot be worn during these tests. You cannot remove and re-apply the  ZIO XT patch monitor.  Your ZIO patch monitor will be mailed 3 day USPS to your address on file. It may take 3-5 days  to receive your monitor after you have been enrolled.  Once you have received your monitor, please review the enclosed instructions. Your monitor  has already been registered assigning a specific monitor serial # to you.  Billing and Patient Assistance Program Information  We have supplied [employer] with any of your insurance information on file for billing purposes. [employer] offers a sliding scale Patient Assistance Program for patients that do not have  insurance, or whose  insurance does not completely cover the cost of the ZIO monitor.  You must apply for the Patient Assistance Program to qualify for this discounted rate.  To apply, please call [employer] at  [phone], select option 4, select option 2, ask to apply for  Patient Assistance Program. Meredeth Ide will ask your household income, and how many people  are in your household. They will quote your out-of-pocket cost based on that information.  [employer] will also be able to set up a 30-month, interest-free payment plan if needed.  Applying the monitor   Shave hair from upper left chest.  Hold abrader disc by orange tab. Rub abrader in 40 strokes over the upper left chest as  indicated in your monitor instructions.  Clean area with 4 enclosed alcohol pads. Let dry.  Apply patch as indicated in monitor instructions. Patch will be placed under collarbone on left  side of chest with arrow pointing upward.  Rub patch adhesive wings for 2 minutes. Remove white label marked "1". Remove the white  label marked "2". Rub patch adhesive wings for 2 additional minutes.  While looking in a mirror, press and release button in center of patch. A small green light will  flash 3-4 times. This will be your only indicator that the monitor has been turned on.  Do not shower for the first 24 hours. You may shower after the first 24 hours.  Press the button if you feel a symptom. You will hear a small click. Record Date, Time and  Symptom in the Patient Logbook.  When you are ready to remove the patch, follow instructions on the last 2 pages of Patient  Logbook. Stick patch monitor onto the last page of Patient Logbook.  Place Patient Logbook in the blue and white box. Use locking tab on box and tape box closed  securely. The blue and white box has prepaid postage on it. Please place it in the mailbox as  soon as possible. Your physician should have your test results approximately 7 days after the  monitor has been mailed back  to [facility].  Call [facility] Customer Care at [phone] if you have questions regarding  your ZIO XT patch monitor. Call them immediately if you see an orange light blinking on your  monitor.  If your monitor falls off in less than 4 days, contact our Monitor department at [phone].  If your monitor becomes loose or falls off after 4 days call [employer] at [phone] for  suggestions on securing your monitor   Follow-Up:  AS NEEDED WITH DR. Shari Prows   Important Information About Sugar         Signed, Meriam Sprague, MD  06/20/2022 8:39 AM    [facility]

## 2022-06-20 ENCOUNTER — Ambulatory Visit (INDEPENDENT_AMBULATORY_CARE_PROVIDER_SITE_OTHER): Payer: BC Managed Care – PPO | Admitting: Cardiology

## 2022-06-20 ENCOUNTER — Ambulatory Visit (INDEPENDENT_AMBULATORY_CARE_PROVIDER_SITE_OTHER): Payer: BC Managed Care – PPO

## 2022-06-20 ENCOUNTER — Encounter: Payer: Self-pay | Admitting: Cardiology

## 2022-06-20 VITALS — BP 132/74 | HR 76 | Ht 64.0 in | Wt 200.0 lb

## 2022-06-20 DIAGNOSIS — I10 Essential (primary) hypertension: Secondary | ICD-10-CM | POA: Diagnosis not present

## 2022-06-20 DIAGNOSIS — R55 Syncope and collapse: Secondary | ICD-10-CM | POA: Diagnosis not present

## 2022-06-20 NOTE — Progress Notes (Unsigned)
z

## 2022-06-20 NOTE — Patient Instructions (Signed)
Medication Instructions:   Your physician recommends that you continue on your current medications as directed. Please refer to the Current Medication list given to you today.  *If you need a refill on your cardiac medications before your next appointment, please call your pharmacy*   Testing/Procedures:  Your physician has requested that you have an echocardiogram. Echocardiography is a painless test that uses sound waves to create images of your heart. It provides your doctor with information about the size and shape of your heart and how well your heart's chambers and valves are working. This procedure takes approximately one hour. There are no restrictions for this procedure.   ZIO XT- Long Term Monitor Instructions  Your physician has requested you wear a ZIO patch monitor for 14 days.  This is a single patch monitor. [employer] supplies one patch monitor per enrollment. Additional stickers are not available. Please do not apply patch if you will be having a Nuclear Stress Test,  Echocardiogram, Cardiac CT, MRI, or Chest Xray during the period you would be wearing the  monitor. The patch cannot be worn during these tests. You cannot remove and re-apply the  ZIO XT patch monitor.  Your ZIO patch monitor will be mailed 3 day USPS to your address on file. It may take 3-5 days  to receive your monitor after you have been enrolled.  Once you have received your monitor, please review the enclosed instructions. Your monitor  has already been registered assigning a specific monitor serial # to you.  Billing and Patient Assistance Program Information  We have supplied [employer] with any of your insurance information on file for billing purposes. [employer] offers a sliding scale Patient Assistance Program for patients that do not have  insurance, or whose insurance does not completely cover the cost of the ZIO monitor.  You must apply for the Patient Assistance Program to qualify for this  discounted rate.  To apply, please call [employer] at [phone], select option 4, select option 2, ask to apply for  Patient Assistance Program. Theodore Demark will ask your household income, and how many people  are in your household. They will quote your out-of-pocket cost based on that information.  [employer] will also be able to set up a 45-month interest-free payment plan if needed.  Applying the monitor   Shave hair from upper left chest.  Hold abrader disc by orange tab. Rub abrader in 40 strokes over the upper left chest as  indicated in your monitor instructions.  Clean area with 4 enclosed alcohol pads. Let dry.  Apply patch as indicated in monitor instructions. Patch will be placed under collarbone on left  side of chest with arrow pointing upward.  Rub patch adhesive wings for 2 minutes. Remove white label marked "1". Remove the white  label marked "2". Rub patch adhesive wings for 2 additional minutes.  While looking in a mirror, press and release button in center of patch. A small green light will  flash 3-4 times. This will be your only indicator that the monitor has been turned on.  Do not shower for the first 24 hours. You may shower after the first 24 hours.  Press the button if you feel a symptom. You will hear a small click. Record Date, Time and  Symptom in the Patient Logbook.  When you are ready to remove the patch, follow instructions on the last 2 pages of Patient  Logbook. Stick patch monitor onto the last page of Patient Logbook.  Place Patient  Logbook in the blue and white box. Use locking tab on box and tape box closed  securely. The blue and white box has prepaid postage on it. Please place it in the mailbox as  soon as possible. Your physician should have your test results approximately 7 days after the  monitor has been mailed back to [facility].  Call [facility] Customer Care at [phone] if you have questions regarding  your ZIO XT patch monitor. Call  them immediately if you see an orange light blinking on your  monitor.  If your monitor falls off in less than 4 days, contact our Monitor department at [phone].  If your monitor becomes loose or falls off after 4 days call [employer] at [phone] for  suggestions on securing your monitor   Follow-Up:  AS NEEDED WITH DR. Shari Prows   Important Information About Sugar

## 2022-06-21 ENCOUNTER — Ambulatory Visit
Admission: RE | Admit: 2022-06-21 | Discharge: 2022-06-21 | Disposition: A | Discharge status: Home / Self Care | Payer: BC Managed Care – PPO | Source: Ambulatory Visit | Attending: Neurology | Admitting: Neurology

## 2022-06-21 DIAGNOSIS — R569 Unspecified convulsions: Secondary | ICD-10-CM

## 2022-06-21 MED ORDER — GADOBENATE DIMEGLUMINE 529 MG/ML IV SOLN
18.0000 mL | Freq: Once | INTRAVENOUS | Status: AC | PRN
  Administered 2022-06-21: 18 mL via INTRAVENOUS

## 2022-06-24 ENCOUNTER — Telehealth: Payer: Self-pay

## 2022-06-24 DIAGNOSIS — R55 Syncope and collapse: Secondary | ICD-10-CM

## 2022-06-24 NOTE — Telephone Encounter (Signed)
Pt called an informed that brain MRI looks good, no tumor, stroke, or bleed

## 2022-07-03 ENCOUNTER — Ambulatory Visit (HOSPITAL_COMMUNITY)
Admission: RE | Admit: 2022-07-03 | Discharge: 2022-07-03 | Disposition: A | Discharge status: Home / Self Care | Payer: BC Managed Care – PPO | Source: Ambulatory Visit | Attending: Cardiology | Admitting: Cardiology

## 2022-07-03 DIAGNOSIS — R55 Syncope and collapse: Secondary | ICD-10-CM | POA: Diagnosis not present

## 2022-07-03 LAB — ECHOCARDIOGRAM COMPLETE
Area-P 1/2: 3.77 cm2
S' Lateral: 2.7 cm

## 2022-07-03 NOTE — Progress Notes (Signed)
*  PRELIMINARY RESULTS* Echocardiogram 2D Echocardiogram has been performed.  Stacey Drain 07/03/2022, 9:18 AM

## 2022-07-26 ENCOUNTER — Telehealth: Payer: Self-pay | Admitting: Cardiology

## 2022-07-26 NOTE — Telephone Encounter (Signed)
Pt is calling in regards to heart monitor results. Requesting call back.

## 2022-07-26 NOTE — Telephone Encounter (Signed)
The patient has been notified of the result and verbalized understanding.  All questions (if any) were answered. Loa Socks, LPN 1/69/4503 8:88 AM

## 2022-07-26 NOTE — Telephone Encounter (Signed)
-----   Message from Baird Lyons, RN sent at 07/25/2022 10:01 AM EDT -----  ----- Message ----- From: Meriam Sprague, MD Sent: 07/25/2022   8:25 AM EDT To: [facility] Triage  Her heart monitor overall looks good. She has frequent extra beats from the top chamber of her heart and occasional extra beats from the bottom chamber of the heart. These are not harmful to her and would not cause her to pass out. They may cause palpitations but if she is not having that sensation or is not bothered by the extra beats, she does not need treatment at this time.

## 2022-08-12 ENCOUNTER — Ambulatory Visit (INDEPENDENT_AMBULATORY_CARE_PROVIDER_SITE_OTHER): Payer: BC Managed Care – PPO | Admitting: *Deleted

## 2022-08-12 DIAGNOSIS — Z13220 Encounter for screening for lipoid disorders: Secondary | ICD-10-CM

## 2022-08-12 DIAGNOSIS — Z131 Encounter for screening for diabetes mellitus: Secondary | ICD-10-CM

## 2022-08-12 DIAGNOSIS — Z3042 Encounter for surveillance of injectable contraceptive: Secondary | ICD-10-CM | POA: Diagnosis not present

## 2022-08-12 MED ORDER — MEDROXYPROGESTERONE ACETATE 150 MG/ML IM SUSP
150.0000 mg | Freq: Once | INTRAMUSCULAR | Status: AC
  Administered 2022-08-12: 150 mg via INTRAMUSCULAR

## 2022-08-12 NOTE — Progress Notes (Signed)
   NURSE VISIT- INJECTION  SUBJECTIVE:  Jennifer Wyatt is a 46 y.o. G64P2002 female here for a Depo Provera for contraception/period management. She is a GYN patient. Also requesting lipid panel and fasting glucose for wellness program for work.  Last physical 11/15/21.  OBJECTIVE:  There were no vitals taken for this visit.  Appears well, in no apparent distress  Injection administered in: Right upper quad. gluteus  Meds ordered this encounter  Medications   medroxyPROGESTERone (DEPO-PROVERA) injection 150 mg    ASSESSMENT: GYN patient Depo Provera for contraception/period management PLAN: Follow-up: in 11-13 weeks for next Depo   Jobe Marker  08/12/2022 9:50 AM

## 2022-08-13 DIAGNOSIS — Z131 Encounter for screening for diabetes mellitus: Secondary | ICD-10-CM | POA: Diagnosis not present

## 2022-08-13 DIAGNOSIS — Z13220 Encounter for screening for lipoid disorders: Secondary | ICD-10-CM | POA: Diagnosis not present

## 2022-08-14 LAB — LIPID PANEL
Chol/HDL Ratio: 3.6 ratio (ref 0.0–4.4)
Cholesterol, Total: 153 mg/dL (ref 100–199)
HDL: 43 mg/dL (ref >39)
LDL Chol Calc (NIH): 95 mg/dL (ref 0–99)
Triglycerides: 80 mg/dL (ref 0–149)
VLDL Cholesterol Cal: 15 mg/dL (ref 5–40)

## 2022-08-14 LAB — GLUCOSE, FASTING: Glucose, Plasma: 85 mg/dL (ref 70–99)

## 2022-09-05 ENCOUNTER — Ambulatory Visit (INDEPENDENT_AMBULATORY_CARE_PROVIDER_SITE_OTHER): Payer: BC Managed Care – PPO | Admitting: Neurology

## 2022-09-05 ENCOUNTER — Encounter: Payer: Self-pay | Admitting: Neurology

## 2022-09-05 VITALS — BP 129/86 | HR 68 | Ht 64.0 in | Wt 201.0 lb

## 2022-09-05 DIAGNOSIS — R55 Syncope and collapse: Secondary | ICD-10-CM

## 2022-09-05 DIAGNOSIS — R569 Unspecified convulsions: Secondary | ICD-10-CM | POA: Diagnosis not present

## 2022-09-05 NOTE — Patient Instructions (Signed)
Good to see you doing well. Proceed with EEG. Our office will call with results, if normal, follow-up as needed. Call for any changes.

## 2022-09-05 NOTE — Progress Notes (Signed)
NEUROLOGY FOLLOW UP OFFICE NOTE  Jennifer Wyatt [id number] 09/06/1976  HISTORY OF PRESENT ILLNESS: I had the pleasure of seeing Jennifer Wyatt in follow-up in the neurology clinic on 09/05/2022. She is again accompanied by her husband who helps supplement the history today. The patient was last seen 3 months ago after an episode of loss of consciousness with shaking on 05/24/22. On her initial visit, she also reported episodes of left arm tightness and left leg numbness. Records and images were personally reviewed where available.  I personally reviewed MRI brain with and without contrast done 05/2022 which was normal.She has not had the EEG done yet. She was kindly seen by Cardiology and TTE and 14-day Zio patch were ordered. Echocardiogram normal, heart monitor showed NSR with frequent SVE which would not cause syncope. She and her husband deny any further similar episodes since 04/2022. They deny any staring/unresponsive episodes, gaps in time, olfactory/gustatory hallucinations, myoclonic jerks. No headaches, dizziness, vision changes, no falls. She mostly notices the left hand tightness and numbness when she does repetitive movements, they go away when she moves to a different job. She usually gets 6-7 hours of sleep when she can.    History on Initial Assessment 05/30/2022: This is a pleasant 46 year old right-handed woman with a history of hypertension presenting for evaluation of seizure-like activity. She was in her usual state of health until 05/24/22, she came home from working third shift and took a nap. She woke up because her left foot was itching, she lifted her leg and it cramped up. She tried to stretch I, but a shock then went all the way up her body and she felt lightheaded. She then woke up on the floor, diaphoretic. Her husband reports that he saw her sitting up on the side of the bed then she fell on the floor. After a couple of minutes, she started shaking for 10-20 seconds. Her eyes  were open, she lay for a while then got up and sat herself on the side of the bed. He recalls she was talking and not confused after. No tongue bite or incontinence. She was brought to the ER where vital signs were normal, EKG NSR. I personally reviewed head CT which did not show any acute changes. Lactic acid was normal, her CK level was 331 (ref [id number]). WBC 12.4. She reported starting Amlodipine 6 days prior, otherwise no other new medications. No alcohol intake. She recalls an episode of loss of consciousness a couple of years ago, she was sitting on the couch and started feeling lightheaded. She got up to use the bathroom, recalls setting herself on the floor and passing out, waking up diaphoretic. She did not seek medical assistance that time.   She has had episodes of left arm tightness and left leg numbness that occur independently. Her husband denies any staring/unresponsiveness, she denies any other gaps in time, myoclonic jerks. Since starting her BP medication, she has noticed a metallic taste when swallowing. She feels woozy/lightheaded even when sitting, "like I am off balance." Sometimes there is a certain position when she stands and feels the sensation for a few minutes. No headaches, nausea/vomiting, diplopia, dysarthria/dysphagia, neck/back pain, bowel/bladder dysfunction. She had a normal birth and early development.  There is no history of febrile convulsions, CNS infections such as meningitis/encephalitis, significant traumatic brain injury, neurosurgical procedures, or family history of seizures.    PAST MEDICAL HISTORY: Past Medical History:  Diagnosis Date   Hypertension    Syncope  Varicosities     MEDICATIONS: Current Outpatient Medications on File Prior to Visit  Medication Sig Dispense Refill   AMLODIPINE BENZOATE PO Take 2.5 mg by mouth daily.     lisinopril-hydrochlorothiazide (ZESTORETIC) 20-25 MG tablet Take 1 tablet by mouth daily. 90 tablet 3    medroxyPROGESTERone (DEPO-PROVERA) 150 MG/ML injection Inject 1 cc every 12 weeks in office 1 mL 4   VITAMIN D PO Take 1 tablet by mouth daily. For 8 days 1 x weekly 50,000 units     No current facility-administered medications on file prior to visit.    ALLERGIES: No Known Allergies  FAMILY HISTORY: Family History  Problem Relation Age of Onset   Thyroid disease Mother    Asthma Father    Hypertension Father    Diabetes Maternal Aunt    Kidney failure Maternal Aunt     SOCIAL HISTORY: Social History   Socioeconomic History   Marital status: Married    Spouse name: Not on file   Number of children: 2   Years of education: Not on file   Highest education level: Not on file  Occupational History   Not on file  Tobacco Use   Smoking status: Never   Smokeless tobacco: Never  Vaping Use   Vaping Use: Never used  Substance and Sexual Activity   Alcohol use: Yes    Comment: occ.   Drug use: No   Sexual activity: Yes    Partners: Male    Birth control/protection: Injection  Other Topics Concern   Not on file  Social History Narrative   Right handed   Lives with husband   Two story home   Social Determinants of Health   Financial Resource Strain: Low Risk  (11/15/2021)   Overall Financial Resource Strain (CARDIA)    Difficulty of Paying Living Expenses: Not hard at all  Food Insecurity: No Food Insecurity (11/15/2021)   Hunger Vital Sign    Worried About Running Out of Food in the Last Year: Never true    Ran Out of Food in the Last Year: Never true  Transportation Needs: No Transportation Needs (11/15/2021)   PRAPARE - Administrator, Civil Service (Medical): No    Lack of Transportation (Non-Medical): No  Physical Activity: Sufficiently Active (11/15/2021)   Exercise Vital Sign    Days of Exercise per Week: 5 days    Minutes of Exercise per Session: 100 min  Stress: Stress Concern Present (11/15/2021)   [employer] of Occupational Health -  Occupational Stress Questionnaire    Feeling of Stress : To some extent  Social Connections: Unknown (11/15/2021)   Social Connection and Isolation Panel [NHANES]    Frequency of Communication with Friends and Family: More than three times a week    Frequency of Social Gatherings with Friends and Family: More than three times a week    Attends Religious Services: Patient refused    Active Member of Clubs or Organizations: No    Attends Banker Meetings: Never    Marital Status: Married  Catering manager Violence: Not At Risk (11/15/2021)   Humiliation, Afraid, Rape, and Kick questionnaire    Fear of Current or Ex-Partner: No    Emotionally Abused: No    Physically Abused: No    Sexually Abused: No     PHYSICAL EXAM: Vitals:   09/05/22 1040  BP: 129/86  Pulse: 68  SpO2: 100%   General: No acute distress Head:  Normocephalic/atraumatic Skin/Extremities: No rash,  no edema Neurological Exam: alert and awake. No aphasia or dysarthria. Fund of knowledge is appropriate. Attention and concentration are normal.   Cranial nerves: Pupils equal, round. Extraocular movements intact with no nystagmus. Visual fields full.  No facial asymmetry.  Motor: Bulk and tone normal, muscle strength 5/5 throughout with no pronator drift.   Finger to nose testing intact.  Gait narrow-based and steady, able to tandem walk adequately.  Romberg negative.   IMPRESSION: This is a pleasant 46 yo RH woman with a history of hypertension who had an episode of loss of consciousness with shaking last 04/2022, no post-event confusion, symptoms suggestive of convulsive syncope. MRI brain normal. Proceed with EEG as discussed. She has had an unremarkable evaluation with Cardiology. No further episodes since 04/2022. Our office will call with EEG results, if normal, follow-up as needed.    Thank you for allowing me to participate in her care.  Please do not hesitate to call for any questions or  concerns.    Patrcia Dolly, M.D.

## 2022-09-16 DIAGNOSIS — R7303 Prediabetes: Secondary | ICD-10-CM | POA: Diagnosis not present

## 2022-09-16 DIAGNOSIS — I129 Hypertensive chronic kidney disease with stage 1 through stage 4 chronic kidney disease, or unspecified chronic kidney disease: Secondary | ICD-10-CM | POA: Diagnosis not present

## 2022-09-16 DIAGNOSIS — E6609 Other obesity due to excess calories: Secondary | ICD-10-CM | POA: Diagnosis not present

## 2022-09-16 DIAGNOSIS — N182 Chronic kidney disease, stage 2 (mild): Secondary | ICD-10-CM | POA: Diagnosis not present

## 2022-09-18 DIAGNOSIS — E559 Vitamin D deficiency, unspecified: Secondary | ICD-10-CM | POA: Diagnosis not present

## 2022-09-18 DIAGNOSIS — N2581 Secondary hyperparathyroidism of renal origin: Secondary | ICD-10-CM | POA: Diagnosis not present

## 2022-09-18 DIAGNOSIS — D631 Anemia in chronic kidney disease: Secondary | ICD-10-CM | POA: Diagnosis not present

## 2022-09-18 DIAGNOSIS — N1831 Chronic kidney disease, stage 3a: Secondary | ICD-10-CM | POA: Diagnosis not present

## 2022-10-30 ENCOUNTER — Ambulatory Visit (INDEPENDENT_AMBULATORY_CARE_PROVIDER_SITE_OTHER): Payer: BC Managed Care – PPO | Admitting: Neurology

## 2022-10-30 DIAGNOSIS — R569 Unspecified convulsions: Secondary | ICD-10-CM | POA: Diagnosis not present

## 2022-10-30 DIAGNOSIS — R55 Syncope and collapse: Secondary | ICD-10-CM | POA: Diagnosis not present

## 2022-10-30 NOTE — Procedures (Signed)
ELECTROENCEPHALOGRAM REPORT  Date of Study: 10/30/2022  Patient's Name: Jennifer Wyatt MRN: [id number] Date of Birth: [birth year]  Referring Provider: Dr. Ellouise Newer  Clinical History: This is a 46 year old woman with episodes of loss of consciousness with shaking. EEG for classification.  Medications: Lisinopril, Amlodipine  Technical Summary: A multichannel digital EEG recording measured by the international 10-20 system with electrodes applied with paste and impedances below 5000 ohms performed in our laboratory with EKG monitoring in an awake and asleep patient.  Hyperventilation and photic stimulation were performed.  The digital EEG was referentially recorded, reformatted, and digitally filtered in a variety of bipolar and referential montages for optimal display.    Description: The patient is awake and asleep during the recording.  During maximal wakefulness, there is a symmetric, medium voltage 10 Hz posterior dominant rhythm that attenuates with eye opening.  The record is symmetric.  During drowsiness and sleep, there is an increase in theta slowing of the background.  Vertex waves and symmetric sleep spindles were seen. Hyperventilation and photic stimulation did not elicit any abnormalities.  There were no epileptiform discharges or electrographic seizures seen.    EKG lead was unremarkable.  Impression: This awake and asleep EEG is normal.    Clinical Correlation: A normal EEG does not exclude a clinical diagnosis of epilepsy.  If further clinical questions remain, prolonged EEG may be helpful.  Clinical correlation is advised.   Ellouise Newer, M.D.

## 2022-10-30 NOTE — Progress Notes (Signed)
EEG complete - results pending. Patient had sewn in tracks making placement difficult.

## 2022-10-31 ENCOUNTER — Telehealth: Payer: Self-pay

## 2022-10-31 NOTE — Telephone Encounter (Signed)
-----   Message from Cameron Sprang, MD sent at 10/31/2022  9:33 AM EDT ----- Pls let her know the brain wave test was normal. Thanks

## 2022-10-31 NOTE — Telephone Encounter (Signed)
Pt called no answer left a voice mail per DPR that the brain wave test was normal

## 2022-11-01 ENCOUNTER — Ambulatory Visit (INDEPENDENT_AMBULATORY_CARE_PROVIDER_SITE_OTHER): Payer: BC Managed Care – PPO | Admitting: *Deleted

## 2022-11-01 DIAGNOSIS — Z3042 Encounter for surveillance of injectable contraceptive: Secondary | ICD-10-CM

## 2022-11-01 MED ORDER — MEDROXYPROGESTERONE ACETATE 150 MG/ML IM SUSP
150.0000 mg | Freq: Once | INTRAMUSCULAR | Status: AC
  Administered 2022-11-01: 150 mg via INTRAMUSCULAR

## 2022-11-01 NOTE — Progress Notes (Signed)
   NURSE VISIT- INJECTION  SUBJECTIVE:  Jennifer Wyatt is a 46 y.o. G45P2002 female here for a Depo Provera for contraception/period management. She is a GYN patient.   OBJECTIVE:  There were no vitals taken for this visit.  Appears well, in no apparent distress  Injection administered in: Left upper quad. gluteus  Meds ordered this encounter  Medications   medroxyPROGESTERone (DEPO-PROVERA) injection 150 mg    ASSESSMENT: GYN patient Depo Provera for contraception/period management PLAN: Follow-up: in 11-13 weeks for next Depo   Alice Rieger  11/01/2022 9:22 AM

## 2023-01-21 ENCOUNTER — Other Ambulatory Visit: Payer: Self-pay | Admitting: Adult Health

## 2023-01-25 ENCOUNTER — Other Ambulatory Visit: Payer: Self-pay | Admitting: Adult Health

## 2023-01-27 ENCOUNTER — Ambulatory Visit: Payer: BC Managed Care – PPO

## 2023-01-28 ENCOUNTER — Ambulatory Visit (INDEPENDENT_AMBULATORY_CARE_PROVIDER_SITE_OTHER): Payer: BC Managed Care – PPO | Admitting: *Deleted

## 2023-01-28 DIAGNOSIS — Z3042 Encounter for surveillance of injectable contraceptive: Secondary | ICD-10-CM

## 2023-01-28 MED ORDER — MEDROXYPROGESTERONE ACETATE 150 MG/ML IM SUSP
150.0000 mg | Freq: Once | INTRAMUSCULAR | Status: AC
  Administered 2023-01-28: 150 mg via INTRAMUSCULAR

## 2023-01-28 NOTE — Progress Notes (Signed)
   NURSE VISIT- INJECTION  SUBJECTIVE:  Jennifer Wyatt is a 47 y.o. G62P2002 female here for a Depo Provera for contraception/period management. She is a GYN patient.   OBJECTIVE:  There were no vitals taken for this visit.  Appears well, in no apparent distress  Injection administered in: Right upper quad. gluteus  Meds ordered this encounter  Medications   medroxyPROGESTERone (DEPO-PROVERA) injection 150 mg    ASSESSMENT: GYN patient Depo Provera for contraception/period management PLAN: Follow-up: in 11-13 weeks for next Depo   Jennifer Wyatt  01/28/2023 9:02 AM

## 2023-04-04 DIAGNOSIS — N1831 Chronic kidney disease, stage 3a: Secondary | ICD-10-CM | POA: Diagnosis not present

## 2023-04-04 DIAGNOSIS — E559 Vitamin D deficiency, unspecified: Secondary | ICD-10-CM | POA: Diagnosis not present

## 2023-04-04 DIAGNOSIS — I9589 Other hypotension: Secondary | ICD-10-CM | POA: Diagnosis not present

## 2023-04-14 DIAGNOSIS — N1831 Chronic kidney disease, stage 3a: Secondary | ICD-10-CM | POA: Diagnosis not present

## 2023-04-14 DIAGNOSIS — E559 Vitamin D deficiency, unspecified: Secondary | ICD-10-CM | POA: Diagnosis not present

## 2023-04-14 DIAGNOSIS — R7303 Prediabetes: Secondary | ICD-10-CM | POA: Diagnosis not present

## 2023-04-21 ENCOUNTER — Ambulatory Visit (INDEPENDENT_AMBULATORY_CARE_PROVIDER_SITE_OTHER): Payer: BC Managed Care – PPO | Admitting: *Deleted

## 2023-04-21 DIAGNOSIS — Z3042 Encounter for surveillance of injectable contraceptive: Secondary | ICD-10-CM | POA: Diagnosis not present

## 2023-04-21 MED ORDER — MEDROXYPROGESTERONE ACETATE 150 MG/ML IM SUSY
150.0000 mg | PREFILLED_SYRINGE | Freq: Once | INTRAMUSCULAR | Status: AC
  Administered 2023-04-21: 150 mg via INTRAMUSCULAR

## 2023-04-21 NOTE — Progress Notes (Signed)
   NURSE VISIT- INJECTION  SUBJECTIVE:  Jennifer Wyatt is a 47 y.o. G82P2002 female here for a Depo Provera for contraception/period management. She is a GYN patient.   OBJECTIVE:  There were no vitals taken for this visit.  Appears well, in no apparent distress  Injection administered in: Left upper quad. gluteus  Meds ordered this encounter  Medications   medroxyPROGESTERone Acetate SUSY 150 mg    ASSESSMENT: GYN patient Depo Provera for contraception/period management PLAN: Follow-up: in 11-13 weeks for next Depo   [name]  04/21/2023 9:09 AM

## 2023-05-27 ENCOUNTER — Encounter: Payer: Self-pay | Admitting: Obstetrics & Gynecology

## 2023-05-27 ENCOUNTER — Ambulatory Visit (INDEPENDENT_AMBULATORY_CARE_PROVIDER_SITE_OTHER): Payer: BC Managed Care – PPO | Admitting: Obstetrics & Gynecology

## 2023-05-27 ENCOUNTER — Other Ambulatory Visit (HOSPITAL_COMMUNITY)
Admission: RE | Admit: 2023-05-27 | Discharge: 2023-05-27 | Disposition: A | Discharge status: Home / Self Care | Payer: BC Managed Care – PPO | Source: Ambulatory Visit | Attending: Obstetrics & Gynecology | Admitting: Obstetrics & Gynecology

## 2023-05-27 VITALS — BP 127/84 | HR 87 | Ht 62.0 in | Wt 204.0 lb

## 2023-05-27 DIAGNOSIS — Z01419 Encounter for gynecological examination (general) (routine) without abnormal findings: Secondary | ICD-10-CM | POA: Diagnosis not present

## 2023-05-27 DIAGNOSIS — Z1231 Encounter for screening mammogram for malignant neoplasm of breast: Secondary | ICD-10-CM

## 2023-05-27 NOTE — Progress Notes (Signed)
Subjective:     Jennifer Wyatt is a 47 y.o. female here for a routine exam.  No LMP recorded. Patient has had an injection. [id number] Birth Control Method:  depo provera Menstrual Calendar(currently): amenorrhea  Current complaints: none.   Current acute medical issues:  none   Recent Gynecologic History No LMP recorded. Patient has had an injection. Last Pap: 11/22,  normal Last mammogram: 11/2021,  normal  Past Medical History:  Diagnosis Date   Hypertension    Syncope    Varicosities     History reviewed. No pertinent surgical history.  OB History     Gravida  2   Para  2   Term  2   Preterm      AB      Living  2      SAB      IAB      Ectopic      Multiple      Live Births  2           Social History   Socioeconomic History   Marital status: Married    Spouse name: Not on file   Number of children: 2   Years of education: Not on file   Highest education level: Not on file  Occupational History   Not on file  Tobacco Use   Smoking status: Never   Smokeless tobacco: Never  Vaping Use   Vaping Use: Never used  Substance and Sexual Activity   Alcohol use: Yes    Comment: occ.   Drug use: No   Sexual activity: Yes    Partners: Male    Birth control/protection: Injection  Other Topics Concern   Not on file  Social History Narrative   Right handed   Lives with husband   Two story home   Social Determinants of Health   Financial Resource Strain: Low Risk  (11/15/2021)   Overall Financial Resource Strain (CARDIA)    Difficulty of Paying Living Expenses: Not hard at all  Food Insecurity: No Food Insecurity (11/15/2021)   Hunger Vital Sign    Worried About Running Out of Food in the Last Year: Never true    Ran Out of Food in the Last Year: Never true  Transportation Needs: No Transportation Needs (11/15/2021)   PRAPARE - Administrator, Civil Service (Medical): No    Lack of Transportation (Non-Medical): No   Physical Activity: Sufficiently Active (11/15/2021)   Exercise Vital Sign    Days of Exercise per Week: 5 days    Minutes of Exercise per Session: 100 min  Stress: Stress Concern Present (11/15/2021)   [employer] of Occupational Health - Occupational Stress Questionnaire    Feeling of Stress : To some extent  Social Connections: Unknown (11/15/2021)   Social Connection and Isolation Panel [NHANES]    Frequency of Communication with Friends and Family: More than three times a week    Frequency of Social Gatherings with Friends and Family: More than three times a week    Attends Religious Services: Patient declined    Database administrator or Organizations: No    Attends Banker Meetings: Never    Marital Status: Married    Family History  Problem Relation Age of Onset   Thyroid disease Mother    Asthma Father    Hypertension Father    Diabetes Maternal Aunt    Kidney failure Maternal Aunt      Current Outpatient  Medications:    AMLODIPINE BENZOATE PO, Take 2.5 mg by mouth daily., Disp: , Rfl:    lisinopril-hydrochlorothiazide (ZESTORETIC) 20-25 MG tablet, TAKE 1 TABLET BY MOUTH DAILY, Disp: 90 tablet, Rfl: 3   medroxyPROGESTERone (DEPO-PROVERA) 150 MG/ML injection, INJECT INTRAMUSCULARLY EVERY 12 WEEKS IN OFFICE, Disp: 1 mL, Rfl: 4   VITAMIN D PO, Take 1 tablet by mouth daily. For 8 days 1 x weekly 50,000 units, Disp: , Rfl:   Review of Systems  Review of Systems  Constitutional: Negative for fever, chills, weight loss, malaise/fatigue and diaphoresis.  HENT: Negative for hearing loss, ear pain, nosebleeds, congestion, sore throat, neck pain, tinnitus and ear discharge.   Eyes: Negative for blurred vision, double vision, photophobia, pain, discharge and redness.  Respiratory: Negative for cough, hemoptysis, sputum production, shortness of breath, wheezing and stridor.   Cardiovascular: Negative for chest pain, palpitations, orthopnea, claudication,  leg swelling and PND.  Gastrointestinal: negative for abdominal pain. Negative for heartburn, nausea, vomiting, diarrhea, constipation, blood in stool and melena.  Genitourinary: Negative for dysuria, urgency, frequency, hematuria and flank pain.  Musculoskeletal: Negative for myalgias, back pain, joint pain and falls.  Skin: Negative for itching and rash.  Neurological: Negative for dizziness, tingling, tremors, sensory change, speech change, focal weakness, seizures, loss of consciousness, weakness and headaches.  Endo/Heme/Allergies: Negative for environmental allergies and polydipsia. Does not bruise/bleed easily.  Psychiatric/Behavioral: Negative for depression, suicidal ideas, hallucinations, memory loss and substance abuse. The patient is not nervous/anxious and does not have insomnia.        Objective:  Blood pressure 127/84, pulse 87, height 5\' 2"  (1.575 m), weight 204 lb (92.5 kg).   Physical Exam  Vitals reviewed. Constitutional: She is oriented to person, place, and time. She appears well-developed and well-nourished.  HENT:  Head: Normocephalic and atraumatic.        Right Ear: External ear normal.  Left Ear: External ear normal.  Nose: Nose normal.  Mouth/Throat: Oropharynx is clear and moist.  Eyes: Conjunctivae and EOM are normal. Pupils are equal, round, and reactive to light. Right eye exhibits no discharge. Left eye exhibits no discharge. No scleral icterus.  Neck: Normal range of motion. Neck supple. No tracheal deviation present. No thyromegaly present.  Cardiovascular: Normal rate, regular rhythm, normal heart sounds and intact distal pulses.  Exam reveals no gallop and no friction rub.   No murmur heard. Respiratory: Effort normal and breath sounds normal. No respiratory distress. She has no wheezes. She has no rales. She exhibits no tenderness.  GI: Soft. Bowel sounds are normal. She exhibits no distension and no mass. There is no tenderness. There is no rebound and  no guarding.  Genitourinary:  Breasts no masses skin changes or nipple changes bilaterally      Vulva is normal without lesions Vagina is pink moist without discharge Cervix normal in appearance and pap is done Uterus is normal size shape and contour Adnexa is negative with normal sized ovaries   Musculoskeletal: Normal range of motion. She exhibits no edema and no tenderness.  Neurological: She is alert and oriented to person, place, and time. She has normal reflexes. She displays normal reflexes. No cranial nerve deficit. She exhibits normal muscle tone. Coordination normal.  Skin: Skin is warm and dry. No rash noted. No erythema. No pallor.  Psychiatric: She has a normal mood and affect. Her behavior is normal. Judgment and thought content normal.       Medications Ordered at today's visit: No orders of  the defined types were placed in this encounter.   Other orders placed at today's visit: Orders Placed This Encounter  Procedures   MM 3D SCREENING MAMMOGRAM BILATERAL BREAST   Glucose, fasting   Lipid panel      Assessment:    Normal Gyn exam.   Depo provera Needs mammogram  Essental hypertension Plan:    As above     Return in about 3 years (around 05/26/2026) for yearly.

## 2023-05-30 DIAGNOSIS — Z01419 Encounter for gynecological examination (general) (routine) without abnormal findings: Secondary | ICD-10-CM | POA: Diagnosis not present

## 2023-05-31 LAB — GLUCOSE, FASTING: Glucose, Plasma: 79 mg/dL (ref 70–99)

## 2023-05-31 LAB — LIPID PANEL
Chol/HDL Ratio: 3.4 ratio (ref 0.0–4.4)
Cholesterol, Total: 159 mg/dL (ref 100–199)
HDL: 47 mg/dL (ref >39)
LDL Chol Calc (NIH): 100 mg/dL — ABNORMAL HIGH (ref 0–99)
Triglycerides: 58 mg/dL (ref 0–149)
VLDL Cholesterol Cal: 12 mg/dL (ref 5–40)

## 2023-06-02 ENCOUNTER — Encounter: Payer: Self-pay | Admitting: *Deleted

## 2023-06-02 LAB — CYTOLOGY - PAP
Chlamydia: NEGATIVE
Comment: NEGATIVE
Comment: NEGATIVE
Comment: NORMAL
Diagnosis: NEGATIVE
High risk HPV: NEGATIVE
Neisseria Gonorrhea: NEGATIVE

## 2023-07-14 ENCOUNTER — Ambulatory Visit: Payer: BC Managed Care – PPO | Admitting: *Deleted

## 2023-07-14 DIAGNOSIS — Z3042 Encounter for surveillance of injectable contraceptive: Secondary | ICD-10-CM | POA: Diagnosis not present

## 2023-07-14 MED ORDER — MEDROXYPROGESTERONE ACETATE 150 MG/ML IM SUSP
150.0000 mg | Freq: Once | INTRAMUSCULAR | Status: AC
  Administered 2023-07-14: 150 mg via INTRAMUSCULAR

## 2023-07-14 NOTE — Progress Notes (Signed)
   NURSE VISIT- INJECTION  SUBJECTIVE:  Jennifer Wyatt is a 47 y.o. G86P2002 female here for a Depo Provera for contraception/period management. She is a GYN patient.   OBJECTIVE:  There were no vitals taken for this visit.  Appears well, in no apparent distress  Injection administered in: Right upper quad. gluteus  Meds ordered this encounter  Medications   medroxyPROGESTERone (DEPO-PROVERA) injection 150 mg    ASSESSMENT: GYN patient Depo Provera for contraception/period management PLAN: Follow-up: in 11-13 weeks for next Depo   Jobe Marker  07/14/2023 9:13 AM

## 2023-10-07 ENCOUNTER — Ambulatory Visit (INDEPENDENT_AMBULATORY_CARE_PROVIDER_SITE_OTHER): Payer: BC Managed Care – PPO

## 2023-10-07 DIAGNOSIS — Z3042 Encounter for surveillance of injectable contraceptive: Secondary | ICD-10-CM | POA: Diagnosis not present

## 2023-10-07 MED ORDER — MEDROXYPROGESTERONE ACETATE 150 MG/ML IM SUSY
150.0000 mg | PREFILLED_SYRINGE | Freq: Once | INTRAMUSCULAR | Status: AC
  Administered 2023-10-07: 150 mg via INTRAMUSCULAR

## 2023-10-07 NOTE — Progress Notes (Signed)
   NURSE VISIT- INJECTION  SUBJECTIVE:  Jennifer Wyatt is a 47 y.o. G52P2002 female here for a Depo Provera for contraception/period management. She is a GYN patient.   OBJECTIVE:  There were no vitals taken for this visit.  Appears well, in no apparent distress  Injection administered in: Left upper quad. gluteus  Meds ordered this encounter  Medications   medroxyPROGESTERone Acetate SUSY 150 mg    ASSESSMENT: GYN patient Depo Provera for contraception/period management PLAN: Follow-up: in 11-13 weeks for next Depo   Caralyn Guile  10/07/2023 9:14 AM

## 2023-12-30 ENCOUNTER — Ambulatory Visit: Payer: BC Managed Care – PPO | Admitting: *Deleted

## 2023-12-30 DIAGNOSIS — Z3042 Encounter for surveillance of injectable contraceptive: Secondary | ICD-10-CM | POA: Diagnosis not present

## 2023-12-30 MED ORDER — MEDROXYPROGESTERONE ACETATE 150 MG/ML IM SUSP
150.0000 mg | Freq: Once | INTRAMUSCULAR | Status: AC
  Administered 2023-12-30: 150 mg via INTRAMUSCULAR

## 2023-12-30 NOTE — Progress Notes (Signed)
   NURSE VISIT- INJECTION  SUBJECTIVE:  Jennifer Wyatt is a 47 y.o. G43P2002 female here for a Depo Provera  for contraception/period management. She is a GYN patient.   OBJECTIVE:  There were no vitals taken for this visit.  Appears well, in no apparent distress  Injection administered in: Right upper quad. gluteus  Meds ordered this encounter  Medications   medroxyPROGESTERone  (DEPO-PROVERA ) injection 150 mg    ASSESSMENT: GYN patient Depo Provera  for contraception/period management PLAN: Follow-up: in 11-13 weeks for next Depo   Rutherford Rover  12/30/2023 10:09 AM

## 2024-01-24 ENCOUNTER — Other Ambulatory Visit: Payer: Self-pay | Admitting: Adult Health

## 2024-03-18 ENCOUNTER — Other Ambulatory Visit: Payer: Self-pay | Admitting: Adult Health

## 2024-03-23 ENCOUNTER — Ambulatory Visit: Payer: BC Managed Care – PPO | Admitting: *Deleted

## 2024-03-23 DIAGNOSIS — Z3042 Encounter for surveillance of injectable contraceptive: Secondary | ICD-10-CM | POA: Diagnosis not present

## 2024-03-23 MED ORDER — MEDROXYPROGESTERONE ACETATE 150 MG/ML IM SUSP
150.0000 mg | Freq: Once | INTRAMUSCULAR | Status: AC
  Administered 2024-03-23: 150 mg via INTRAMUSCULAR

## 2024-03-23 NOTE — Progress Notes (Signed)
   NURSE VISIT- INJECTION  SUBJECTIVE:  Jennifer Wyatt is a 48 y.o. G70P2002 female here for a Depo Provera for contraception/period management. She is a GYN patient.   OBJECTIVE:  There were no vitals taken for this visit.  Appears well, in no apparent distress  Injection administered in: Left upper quad. gluteus  Meds ordered this encounter  Medications   medroxyPROGESTERone (DEPO-PROVERA) injection 150 mg    ASSESSMENT: GYN patient Depo Provera for contraception/period management PLAN: Follow-up: in 11-13 weeks for next Depo   Jobe Marker  03/23/2024 10:54 AM

## 2024-06-15 ENCOUNTER — Ambulatory Visit: Admitting: *Deleted

## 2024-06-15 DIAGNOSIS — Z3042 Encounter for surveillance of injectable contraceptive: Secondary | ICD-10-CM | POA: Diagnosis not present

## 2024-06-15 MED ORDER — MEDROXYPROGESTERONE ACETATE 150 MG/ML IM SUSP
150.0000 mg | Freq: Once | INTRAMUSCULAR | Status: AC
  Administered 2024-06-15: 150 mg via INTRAMUSCULAR

## 2024-06-15 NOTE — Progress Notes (Signed)
   NURSE VISIT- INJECTION  SUBJECTIVE:  Jennifer Wyatt is a 48 y.o. G67P2002 female here for a Depo Provera  for contraception/period management. She is a GYN patient.   OBJECTIVE:  There were no vitals taken for this visit.  Appears well, in no apparent distress  Injection administered in: Right upper quad. gluteus  No orders of the defined types were placed in this encounter.   ASSESSMENT: GYN patient Depo Provera  for contraception/period management PLAN: Follow-up: in 11-13 weeks for next Depo   Kerrie Peek  06/15/2024 9:38 AM

## 2024-08-18 ENCOUNTER — Ambulatory Visit (INDEPENDENT_AMBULATORY_CARE_PROVIDER_SITE_OTHER): Admitting: Ophthalmology

## 2024-08-18 ENCOUNTER — Other Ambulatory Visit (INDEPENDENT_AMBULATORY_CARE_PROVIDER_SITE_OTHER): Payer: Self-pay | Admitting: Ophthalmology

## 2024-08-18 ENCOUNTER — Encounter (INDEPENDENT_AMBULATORY_CARE_PROVIDER_SITE_OTHER): Payer: Self-pay | Admitting: Ophthalmology

## 2024-08-18 DIAGNOSIS — H35033 Hypertensive retinopathy, bilateral: Secondary | ICD-10-CM

## 2024-08-18 DIAGNOSIS — H33323 Round hole, bilateral: Secondary | ICD-10-CM

## 2024-08-18 DIAGNOSIS — H3321 Serous retinal detachment, right eye: Secondary | ICD-10-CM | POA: Diagnosis not present

## 2024-08-18 DIAGNOSIS — H33101 Unspecified retinoschisis, right eye: Secondary | ICD-10-CM | POA: Diagnosis not present

## 2024-08-18 DIAGNOSIS — I10 Essential (primary) hypertension: Secondary | ICD-10-CM | POA: Diagnosis not present

## 2024-08-18 DIAGNOSIS — H35413 Lattice degeneration of retina, bilateral: Secondary | ICD-10-CM

## 2024-08-18 MED ORDER — PREDNISOLONE ACETATE 1 % OP SUSP: 1.0000 [drp] | Freq: Four times a day (QID) | OPHTHALMIC | 0 refills | Status: AC

## 2024-08-18 NOTE — Progress Notes (Signed)
 [facility] - Clinic Note  08/18/2024   CHIEF COMPLAINT Patient presents for Retina Evaluation  HISTORY OF PRESENT ILLNESS: Jennifer Wyatt is a 48 y.o. female who presents to the clinic today for:  HPI     Retina Evaluation   In right eye.  This started 2 days ago.  Duration of 2 days.  Associated Symptoms Floaters.  I, the attending physician,  performed the HPI with the patient and updated documentation appropriately.        Comments   Retina eval per Dr Erika  for possible tear pt went on Monday for her routine eye exam she states the only things she has noticed is floaters but is nothing new she denies any flashes or vision changes       Last edited by Valdemar Rogue, MD on 08/18/2024  5:05 PM.     Pt states she was seen by Dr. Erika for annual eye exam, no vision issues just reports floaters. No FOL.   Referring physician: [facility]  [address] [place],  [place] [id number]  HISTORICAL INFORMATION:  Selected notes from the MEDICAL RECORD NUMBER Referred by Dr. Erika ([facility] [place]) LEE:  Ocular Hx- PMH-   CURRENT MEDICATIONS: Current Outpatient Medications (Ophthalmic Drugs)  Medication Sig   prednisoLONE  acetate (PRED FORTE ) 1 % ophthalmic suspension Place 1 drop into the right eye 4 (four) times daily.   No current facility-administered medications for this visit. (Ophthalmic Drugs)   Current Outpatient Medications (Other)  Medication Sig   AMLODIPINE BENZOATE PO Take 2.5 mg by mouth daily. (Patient not taking: Reported on 10/07/2023)   lisinopril -hydrochlorothiazide  (ZESTORETIC ) 20-25 MG tablet TAKE 1 TABLET BY MOUTH DAILY   medroxyPROGESTERone  (DEPO-PROVERA ) 150 MG/ML injection INJECT 1 ML IN THE MUSCLE EVERY 12 WEEKS IN OFFICE   VITAMIN D PO Take 1 tablet by mouth daily. For 8 days 1 x weekly 50,000 units   No current facility-administered medications for this visit. (Other)    REVIEW OF SYSTEMS: ROS   Positive for: Cardiovascular Last edited by Resa Delon ORN, COT on 08/18/2024  8:11 AM.     ALLERGIES No Known Allergies PAST MEDICAL HISTORY Past Medical History:  Diagnosis Date   Hypertension    Syncope    Varicosities    History reviewed. No pertinent surgical history. FAMILY HISTORY Family History  Problem Relation Age of Onset   Thyroid  disease Mother    Asthma Father    Hypertension Father    Diabetes Maternal Aunt    Kidney failure Maternal Aunt    SOCIAL HISTORY Social History   Tobacco Use   Smoking status: Never   Smokeless tobacco: Never  Vaping Use   Vaping status: Never Used  Substance Use Topics   Alcohol use: Yes    Comment: occ.   Drug use: No       OPHTHALMIC EXAM:  Base Eye Exam     Visual Acuity (Snellen - Linear)       Right Left   Dist cc 20/30 20/30 -2   Dist ph cc 20/20 -3 20/20         Tonometry (Tonopen, 8:18 AM)       Right Left   Pressure 13 14         Pupils       Dark Light Shape React APD   Right 4 3 Round Brisk None   Left 4 3 Round Brisk None  Visual Fields       Left Right    Full Full         Extraocular Movement       Right Left    Full, Ortho Full, Ortho         Neuro/Psych     Oriented x3: Yes   Mood/Affect: Normal         Dilation     Both eyes: 2.5% Phenylephrine @ 8:18 AM           Slit Lamp and Fundus Exam     External Exam       Right Left   External Normal Normal         Slit Lamp Exam       Right Left   Lids/Lashes Normal Normal   Conjunctiva/Sclera mild melanosis mild melanosis   Cornea Clear Clear   Anterior Chamber Deep and clear Deep and clear   Iris Round and Dilated Round and Dilated   Lens Clear Clear   Anterior Vitreous mild syneresis, no pigment mild syneresis, no pigment         Fundus Exam       Right Left   Disc Pink and Sharp Pink and Sharp   C/D Ratio 0.4 0.3   Macula Flat, Good foveal  reflex, no heme or edema Flat, Good foveal reflex, no heme or edema   Vessels mild tortuosity, Copper wiring mild tortuosity, Copper wiring   Periphery Pigmented lattice inferiorly, bullous schisis cavity from 0730-1030 w/ large outer retinal tear along inferior segment Attached, pigmented lattice w/ atrophic holes inferiorly, ?shallow schisis IT periphery           IMAGING AND PROCEDURES  Imaging and Procedures for 08/18/2024  OCT, Retina - OU - Both Eyes       Right Eye Quality was good. Central Foveal Thickness: 252. Progression has no prior data. Findings include normal foveal contour, no IRF, subretinal fluid, vitreomacular adhesion (Schisis cavity w/o retinal hole, focal RD IT periphery caught on widefield. ).   Left Eye Quality was good. Central Foveal Thickness: 255. Progression has no prior data. Findings include normal foveal contour, no IRF, no SRF, vitreomacular adhesion .   Notes *Images captured and stored on drive  Diagnosis / Impression:  OD: Schisis cavity w/ outer retinal tear and focal RD IT periphery -- caught on widefield.  OS: NFP, no IRF/SRF.   Clinical management:  See below  Abbreviations: NFP - Normal foveal profile. CME - cystoid macular edema. PED - pigment epithelial detachment. IRF - intraretinal fluid. SRF - subretinal fluid. EZ - ellipsoid zone. ERM - epiretinal membrane. ORA - outer retinal atrophy. ORT - outer retinal tubulation. SRHM - subretinal hyper-reflective material. IRHM - intraretinal hyper-reflective material      Repair Retinal Detach, Photocoag - OD - Right Eye       LASER PROCEDURE NOTE  Procedure:  Barrier laser retinopexy using slit lamp laser, RIGHT eye   Diagnosis:   Retinochisis cavity w/ outer retinal tear and focal RD IT periphery                      Lattice degeneration w/ atrophic retinal holes inferior periphery  Surgeon: Redell Hans, MD, PhD  Anesthesia: Topical  Informed consent obtained, operative eye  marked, and time out performed prior to initiation of laser.   Laser settings:  Lumenis Smart532 laser, slit lamp Lens: Mainster PRP 165 Power: 270 mW Spot size: 500  microns Duration: 30 msec  # spots: 851  Placement of laser: Using a Mainster PRP 165 contact lens at the slit lamp, laser was placed in three+ confluent rows posterior to and around schisis cavity with outer retinal tear in IT periphery and posterior to the pigmented lattice degeneration w/ outer retinal holes in inferior periphery.  Complications: None.  Patient tolerated the procedure well and received written and verbal post-procedure care information/education.          ASSESSMENT/PLAN:   ICD-10-CM   1. Right retinoschisis  H33.101 OCT, Retina - OU - Both Eyes    Repair Retinal Detach, Photocoag - OD - Right Eye    2. Right retinal detachment  H33.21 Repair Retinal Detach, Photocoag - OD - Right Eye    3. Bilateral retinal lattice degeneration  H35.413 Repair Retinal Detach, Photocoag - OD - Right Eye    4. Retinal hole of both eyes  H33.323 Repair Retinal Detach, Photocoag - OD - Right Eye    5. Essential hypertension  I10     6. Hypertensive retinopathy of both eyes  H35.033      1,2. Retinoschisis w/ outer retinal tear and focal retinal detachment OD  - Pt presents with onset of floaters, no FOL-pt was seen for annual eye exam by Dr. Erika, telehealth.  - Exam today OD shows  bullous schisis cavity from 0730-1030 w/ large outer retinal tear along inferior segment - BCVA OU: 20/20, pin holed from 20/30 - The incidence, risk factors, and natural history of retinal detachment was discussed with patient.  Potential treatment options including delimiting laser, pneumatic retinopexy, scleral buckle, and vitrectomy, cryotherapy and laser, and the use of air, gas, and oil discussed with patient.  The risks of blindness, loss of vision, infection, hemorrhage, cataract progression or lens displacement were  discussed with patient. - recommend laser retinopexy OD today, 08.20.25 - pt wishes to proceed with laser procedure. - RBA of procedure discussed, questions answered - informed consent obtained and signed - Begin prednisolone  gtts QID OD x 7 days  - f/u 1-2 weeks, potential retinopexy OS  3,4. Lattice degeneration w/ atrophic holes, OU  - Exam OD today shows pigmented lattice inferiorly, bullous schisis cavity from 0730-1030 w/ large outer retinal tear along inferior segment, OS pigmented lattice w/ atrophic holes inferiorly, ?shallow schisis IT periphery  - discussed findings, prognosis, and treatment options including observation - recommend laser retinopexy OU-OD today, will schedule laser retinopexy OS.  - pt wishes to proceed with laser - RBA of procedure discussed, questions answered - informed consent obtained and signed - see above.    5,6. Hypertensive retinopathy OU - discussed importance of tight BP control - monitor    Ophthalmic Meds Ordered this visit:  Meds ordered this encounter  Medications   prednisoLONE  acetate (PRED FORTE ) 1 % ophthalmic suspension    Sig: Place 1 drop into the right eye 4 (four) times daily.    Dispense:  10 mL    Refill:  0     Return for 1-2 weeks s/p laser retinopexy OD, possible retinopexy OS , Dilated Exam, OCT.  There are no Patient Instructions on file for this visit.  Explained the diagnoses, plan, and follow up with the patient and they expressed understanding.  Patient expressed understanding of the importance of proper follow up care.    This document serves as a record of services personally performed by Redell JUDITHANN Hans, MD, PhD. It was created on their behalf by [facility]  Antonetta, an ophthalmic technician. The creation of this record is the provider's dictation and/or activities during the visit.    Electronically signed by: Almetta Antonetta, OA, 08/18/24  5:12 PM  Redell JUDITHANN Hans, M.D., Ph.D. Diseases & Surgery of the Retina  and Vitreous [facility] 08/18/2024  I have reviewed the above documentation for accuracy and completeness, and I agree with the above. Redell JUDITHANN Hans, M.D., Ph.D. 08/18/24 5:13 PM   Abbreviations: M myopia (nearsighted); A astigmatism; H hyperopia (farsighted); P presbyopia; Mrx spectacle prescription;  CTL contact lenses; OD right eye; OS left eye; OU both eyes  XT exotropia; ET esotropia; PEK punctate epithelial keratitis; PEE punctate epithelial erosions; DES dry eye syndrome; MGD meibomian gland dysfunction; ATs artificial tears; PFAT's preservative free artificial tears; NSC nuclear sclerotic cataract; PSC posterior subcapsular cataract; ERM epi-retinal membrane; PVD posterior vitreous detachment; RD retinal detachment; DM diabetes mellitus; DR diabetic retinopathy; NPDR non-proliferative diabetic retinopathy; PDR proliferative diabetic retinopathy; CSME clinically significant macular edema; DME diabetic macular edema; dbh dot blot hemorrhages; CWS cotton wool spot; POAG primary open angle glaucoma; C/D cup-to-disc ratio; HVF humphrey visual field; GVF goldmann visual field; OCT optical coherence tomography; IOP intraocular pressure; BRVO Branch retinal vein occlusion; CRVO central retinal vein occlusion; CRAO central retinal artery occlusion; BRAO branch retinal artery occlusion; RT retinal tear; SB scleral buckle; PPV pars plana vitrectomy; VH Vitreous hemorrhage; PRP panretinal laser photocoagulation; IVK intravitreal kenalog; VMT vitreomacular traction; MH Macular hole;  NVD neovascularization of the disc; NVE neovascularization elsewhere; AREDS age related eye disease study; ARMD age related macular degeneration; POAG primary open angle glaucoma; EBMD epithelial/anterior basement membrane dystrophy; ACIOL anterior chamber intraocular lens; IOL intraocular lens; PCIOL posterior chamber intraocular lens; Phaco/IOL phacoemulsification with intraocular lens placement; PRK  photorefractive keratectomy; LASIK laser assisted in situ keratomileusis; HTN hypertension; DM diabetes mellitus; COPD chronic obstructive pulmonary disease

## 2024-08-24 NOTE — Progress Notes (Signed)
 [facility] - Clinic Note  09/01/2024   CHIEF COMPLAINT Patient presents for Retina Follow Up  HISTORY OF PRESENT ILLNESS: Jennifer Wyatt is a 48 y.o. female who presents to the clinic today for:  HPI     Retina Follow Up   Patient presents with  Other.  In both eyes.  This started 2 weeks ago.  I, the attending physician,  performed the HPI with the patient and updated documentation appropriately.        Comments   Patient here for 2 weeks retina follow up for s/p retinopexy OD and poss retinopexy OS. Patient states vision good with glasses on. OD has a little pain. Finished drops.       Last edited by Valdemar Rogue, MD on 09/05/2024  9:38 PM.     Patient states the right eye was tender after the laser for a few days.   Referring physician: No referring provider defined for this encounter.  HISTORICAL INFORMATION:  Selected notes from the MEDICAL RECORD NUMBER Referred by Dr. Erika ([facility]) LEE:  Ocular Hx- PMH-   CURRENT MEDICATIONS: Current Outpatient Medications (Ophthalmic Drugs)  Medication Sig   prednisoLONE  acetate (PRED FORTE ) 1 % ophthalmic suspension Place 1 drop into the right eye 4 (four) times daily. (Patient not taking: Reported on 09/01/2024)   No current facility-administered medications for this visit. (Ophthalmic Drugs)   Current Outpatient Medications (Other)  Medication Sig   lisinopril -hydrochlorothiazide  (ZESTORETIC ) 20-25 MG tablet TAKE 1 TABLET BY MOUTH DAILY   medroxyPROGESTERone  (DEPO-PROVERA ) 150 MG/ML injection INJECT 1 ML IN THE MUSCLE EVERY 12 WEEKS IN OFFICE   VITAMIN D PO Take 1 tablet by mouth daily. For 8 days 1 x weekly 50,000 units   AMLODIPINE BENZOATE PO Take 2.5 mg by mouth daily. (Patient not taking: Reported on 09/01/2024)   No current facility-administered medications for this visit. (Other)   REVIEW OF SYSTEMS: ROS   Positive for: Cardiovascular, Eyes Last edited by Orval Asberry RAMAN,  COA on 09/01/2024  9:11 AM.      ALLERGIES No Known Allergies PAST MEDICAL HISTORY Past Medical History:  Diagnosis Date   Hypertension    Syncope    Varicosities    History reviewed. No pertinent surgical history. FAMILY HISTORY Family History  Problem Relation Age of Onset   Thyroid  disease Mother    Asthma Father    Hypertension Father    Diabetes Maternal Aunt    Kidney failure Maternal Aunt    SOCIAL HISTORY Social History   Tobacco Use   Smoking status: Never   Smokeless tobacco: Never  Vaping Use   Vaping status: Never Used  Substance Use Topics   Alcohol use: Yes    Comment: occ.   Drug use: No       OPHTHALMIC EXAM:  Base Eye Exam     Visual Acuity (Snellen - Linear)       Right Left   Dist cc 20/20 20/20    Correction: Glasses         Tonometry (Tonopen, 9:07 AM)       Right Left   Pressure 17 12         Pupils       Dark Light Shape React APD   Right 3 2 Round Brisk None   Left 3 2 Round Brisk None         Visual Fields (Counting fingers)       Left Right  Full Full         Extraocular Movement       Right Left    Full, Ortho Full, Ortho         Neuro/Psych     Oriented x3: Yes   Mood/Affect: Normal         Dilation     Both eyes: 1.0% Mydriacyl, 2.5% Phenylephrine @ 9:07 AM           Slit Lamp and Fundus Exam     External Exam       Right Left   External Normal Normal         Slit Lamp Exam       Right Left   Lids/Lashes Normal Normal   Conjunctiva/Sclera mild melanosis mild melanosis   Cornea Clear Clear   Anterior Chamber Deep and clear Deep and clear   Iris Round and Dilated Round and Dilated   Lens Clear Clear   Anterior Vitreous mild syneresis, no pigment mild syneresis, no pigment         Fundus Exam       Right Left   Disc Pink and Sharp Pink and Sharp   C/D Ratio 0.4 0.3   Macula Flat, Good foveal reflex, no heme or edema Flat, Good foveal reflex, no heme or edema    Vessels mild tortuosity, Copper wiring mild tortuosity, Copper wiring   Periphery Pigmented lattice inferiorly, bullous schisis cavity from 0730-1030 w/ large outer retinal tear along inferior segment- good laser changes surrounding from 0600-1030, no new RT/RD Attached, pigmented lattice w/ atrophic holes [id number] ?shallow schisis IT periphery           Refraction     Wearing Rx       Sphere Cylinder Axis Add   Right -2.00 +1.25 149 +2.00   Left -1.75 +1.50 031 +2.00           IMAGING AND PROCEDURES  Imaging and Procedures for 09/01/2024  OCT, Retina - OU - Both Eyes       Right Eye Quality was good. Central Foveal Thickness: 258. Progression has been stable. Findings include normal foveal contour, no IRF, no SRF, subretinal fluid, vitreomacular adhesion (Schisis cavity with outer retinal hole, focal RD IT periphery caught on widefield-- not imaged today).   Left Eye Quality was good. Central Foveal Thickness: 253. Progression has been stable. Findings include normal foveal contour, no IRF, no SRF, vitreomacular adhesion .   Notes *Images captured and stored on drive  Diagnosis / Impression:  OD: Schisis cavity w/ outer retinal tear and focal RD IT periphery -- caught on widefield- not imaged today  OS: NFP, no IRF/SRF.   Clinical management:  See below  Abbreviations: NFP - Normal foveal profile. CME - cystoid macular edema. PED - pigment epithelial detachment. IRF - intraretinal fluid. SRF - subretinal fluid. EZ - ellipsoid zone. ERM - epiretinal membrane. ORA - outer retinal atrophy. ORT - outer retinal tubulation. SRHM - subretinal hyper-reflective material. IRHM - intraretinal hyper-reflective material           ASSESSMENT/PLAN:   ICD-10-CM   1. Right retinoschisis  H33.101 OCT, Retina - OU - Both Eyes    2. Right retinal detachment  H33.21 OCT, Retina - OU - Both Eyes    3. Bilateral retinal lattice degeneration  H35.413     4. Retinal hole of both eyes   H33.323     5. Essential hypertension  I10     6.  Hypertensive retinopathy of both eyes  H35.033      1,2. Retinoschisis w/ outer retinal tear and focal retinal detachment OD  - Pt presented with new-onset of floaters, no FOL-pt was seen for annual eye exam by Dr. Erika, telehealth.  - original exam OD showed bullous schisis cavity from 0730-1030 w/ large outer retinal tear along inferior segment - BCVA OU: 20/20, pin holed from 20/30 - s/p retinopexy OD 08.20.25 - good laser surrounding, no new RT/RD  3,4. Lattice degeneration w/ atrophic holes, OU  - Exam OD today shows pigmented lattice inferiorly, bullous schisis cavity from 0730-1030 w/ large outer retinal tear along inferior segment, OS pigmented lattice w/ atrophic holes 0430-0730, ?shallow schisis IT periphery  - s/p retinopexy OD 08.20.25 - good laser changes in place OD - recommend retinopexy OS -- patient would like to come back another day - f/u within 1 month, DFE, OCT, possible retinopexy OS    5,6. Hypertensive retinopathy OU - discussed importance of tight BP control - monitor    Ophthalmic Meds Ordered this visit:  No orders of the defined types were placed in this encounter.    Return in about 4 weeks (around 09/29/2024) for f/u Schisis and retinal holes OS, DFE, OCT, Possible, LASER RETINOPEXY, OS.  There are no Patient Instructions on file for this visit.  Explained the diagnoses, plan, and follow up with the patient and they expressed understanding.  Patient expressed understanding of the importance of proper follow up care.    This document serves as a record of services personally performed by Redell JUDITHANN Hans, MD, PhD. It was created on their behalf by Almetta Pesa, an ophthalmic technician. The creation of this record is the provider's dictation and/or activities during the visit.    Electronically signed by: Almetta Pesa, OA, 09/06/24  12:19 AM  This document serves as a record of services  personally performed by Redell JUDITHANN Hans, MD, PhD. It was created on their behalf by Wanda GEANNIE Keens, COT an ophthalmic technician. The creation of this record is the provider's dictation and/or activities during the visit.    Electronically signed by:  Wanda GEANNIE Keens, COT  09/06/24 12:19 AM  Redell JUDITHANN Hans, M.D., Ph.D. Diseases & Surgery of the Retina and Vitreous [facility] 09/01/2024  I have reviewed the above documentation for accuracy and completeness, and I agree with the above. Redell JUDITHANN Hans, M.D., Ph.D. 09/06/24 12:26 AM   Abbreviations: M myopia (nearsighted); A astigmatism; H hyperopia (farsighted); P presbyopia; Mrx spectacle prescription;  CTL contact lenses; OD right eye; OS left eye; OU both eyes  XT exotropia; ET esotropia; PEK punctate epithelial keratitis; PEE punctate epithelial erosions; DES dry eye syndrome; MGD meibomian gland dysfunction; ATs artificial tears; PFAT's preservative free artificial tears; NSC nuclear sclerotic cataract; PSC posterior subcapsular cataract; ERM epi-retinal membrane; PVD posterior vitreous detachment; RD retinal detachment; DM diabetes mellitus; DR diabetic retinopathy; NPDR non-proliferative diabetic retinopathy; PDR proliferative diabetic retinopathy; CSME clinically significant macular edema; DME diabetic macular edema; dbh dot blot hemorrhages; CWS cotton wool spot; POAG primary open angle glaucoma; C/D cup-to-disc ratio; HVF humphrey visual field; GVF goldmann visual field; OCT optical coherence tomography; IOP intraocular pressure; BRVO Branch retinal vein occlusion; CRVO central retinal vein occlusion; CRAO central retinal artery occlusion; BRAO branch retinal artery occlusion; RT retinal tear; SB scleral buckle; PPV pars plana vitrectomy; VH Vitreous hemorrhage; PRP panretinal laser photocoagulation; IVK intravitreal kenalog; VMT vitreomacular traction; MH Macular hole;  NVD neovascularization  of the disc; NVE  neovascularization elsewhere; AREDS age related eye disease study; ARMD age related macular degeneration; POAG primary open angle glaucoma; EBMD epithelial/anterior basement membrane dystrophy; ACIOL anterior chamber intraocular lens; IOL intraocular lens; PCIOL posterior chamber intraocular lens; Phaco/IOL phacoemulsification with intraocular lens placement; PRK photorefractive keratectomy; LASIK laser assisted in situ keratomileusis; HTN hypertension; DM diabetes mellitus; COPD chronic obstructive pulmonary disease

## 2024-09-01 ENCOUNTER — Ambulatory Visit (INDEPENDENT_AMBULATORY_CARE_PROVIDER_SITE_OTHER): Admitting: Ophthalmology

## 2024-09-01 ENCOUNTER — Encounter (INDEPENDENT_AMBULATORY_CARE_PROVIDER_SITE_OTHER): Payer: Self-pay | Admitting: Ophthalmology

## 2024-09-01 DIAGNOSIS — H33101 Unspecified retinoschisis, right eye: Secondary | ICD-10-CM

## 2024-09-01 DIAGNOSIS — H33323 Round hole, bilateral: Secondary | ICD-10-CM | POA: Diagnosis not present

## 2024-09-01 DIAGNOSIS — H35033 Hypertensive retinopathy, bilateral: Secondary | ICD-10-CM | POA: Diagnosis not present

## 2024-09-01 DIAGNOSIS — H35413 Lattice degeneration of retina, bilateral: Secondary | ICD-10-CM

## 2024-09-01 DIAGNOSIS — H3321 Serous retinal detachment, right eye: Secondary | ICD-10-CM | POA: Diagnosis not present

## 2024-09-01 DIAGNOSIS — I10 Essential (primary) hypertension: Secondary | ICD-10-CM

## 2024-09-05 ENCOUNTER — Encounter (INDEPENDENT_AMBULATORY_CARE_PROVIDER_SITE_OTHER): Payer: Self-pay | Admitting: Ophthalmology

## 2024-09-07 ENCOUNTER — Ambulatory Visit: Admitting: *Deleted

## 2024-09-07 DIAGNOSIS — Z3042 Encounter for surveillance of injectable contraceptive: Secondary | ICD-10-CM | POA: Diagnosis not present

## 2024-09-07 MED ORDER — MEDROXYPROGESTERONE ACETATE 150 MG/ML IM SUSP
150.0000 mg | Freq: Once | INTRAMUSCULAR | Status: AC
  Administered 2024-09-07: 150 mg via INTRAMUSCULAR

## 2024-09-07 NOTE — Progress Notes (Signed)
   NURSE VISIT- INJECTION  SUBJECTIVE:  Jennifer Wyatt is a 48 y.o. [id number] female here for a Depo Provera  for contraception/period management. She is a GYN patient.   OBJECTIVE:  There were no vitals taken for this visit.  Appears well, in no apparent distress  Injection administered in: Left upper quad. gluteus  Meds ordered this encounter  Medications   medroxyPROGESTERone  (DEPO-PROVERA ) injection 150 mg    ASSESSMENT: GYN patient Depo Provera  for contraception/period management PLAN: Follow-up: in 11-13 weeks for next Depo   Alan LITTIE Fischer  09/07/2024 9:05 AM

## 2024-09-29 NOTE — Progress Notes (Signed)
 [facility] - Clinic Note  10/11/2024   CHIEF COMPLAINT Patient presents for Retina Follow Up  HISTORY OF PRESENT ILLNESS: Jennifer Wyatt is a 48 y.o. female who presents to the clinic today for:  HPI     Retina Follow Up   Patient presents with  Other.  In both eyes.  This started 2 weeks ago.  I, the attending physician,  performed the HPI with the patient and updated documentation appropriately.        Comments   Pt denies any changes in vision/no FOL/floaters/pain. Pt is not using any drops.      Last edited by Valdemar Rogue, MD on 10/15/2024 10:08 PM.      Patient states the right eye was tender after the laser for a few days.   Referring physician: No referring provider defined for this encounter.  HISTORICAL INFORMATION:  Selected notes from the MEDICAL RECORD NUMBER Referred by Dr. Erika ([facility]) LEE:  Ocular Hx- PMH-   CURRENT MEDICATIONS: Current Outpatient Medications (Ophthalmic Drugs)  Medication Sig   prednisoLONE  acetate (PRED FORTE ) 1 % ophthalmic suspension Place 1 drop into the right eye 4 (four) times daily. (Patient not taking: Reported on 09/01/2024)   No current facility-administered medications for this visit. (Ophthalmic Drugs)   Current Outpatient Medications (Other)  Medication Sig   AMLODIPINE BENZOATE PO Take 2.5 mg by mouth daily. (Patient not taking: Reported on 09/01/2024)   lisinopril -hydrochlorothiazide  (ZESTORETIC ) 20-25 MG tablet TAKE 1 TABLET BY MOUTH DAILY   medroxyPROGESTERone  (DEPO-PROVERA ) 150 MG/ML injection INJECT 1 ML IN THE MUSCLE EVERY 12 WEEKS IN OFFICE   VITAMIN D PO Take 1 tablet by mouth daily. For 8 days 1 x weekly 50,000 units   No current facility-administered medications for this visit. (Other)   REVIEW OF SYSTEMS: ROS   Positive for: Cardiovascular, Eyes Last edited by Elnor Avelina RAMAN, COT on 10/11/2024  2:41 PM.       ALLERGIES No Known Allergies PAST MEDICAL  HISTORY Past Medical History:  Diagnosis Date   Hypertension    Syncope    Varicosities    History reviewed. No pertinent surgical history. FAMILY HISTORY Family History  Problem Relation Age of Onset   Thyroid  disease Mother    Asthma Father    Hypertension Father    Diabetes Maternal Aunt    Kidney failure Maternal Aunt    SOCIAL HISTORY Social History   Tobacco Use   Smoking status: Never   Smokeless tobacco: Never  Vaping Use   Vaping status: Never Used  Substance Use Topics   Alcohol use: Yes    Comment: occ.   Drug use: No       OPHTHALMIC EXAM:  Base Eye Exam     Visual Acuity (Snellen - Linear)       Right Left   Dist cc 20/20 20/20 -2   Dist ph cc -- 20/20 -1    Correction: Glasses         Tonometry (Tonopen, 2:39 PM)       Right Left   Pressure 18 14         Pupils       Pupils Dark Light Shape React APD   Right PERRL 4 2 Round Brisk None   Left PERRL 4 2 Round Brisk None         Visual Fields       Left Right    Full Full  Extraocular Movement       Right Left    Full, Ortho Full, Ortho         Neuro/Psych     Oriented x3: Yes   Mood/Affect: Normal         Dilation     Both eyes: 1.0% Mydriacyl, 2.5% Phenylephrine @ 2:39 PM           Slit Lamp and Fundus Exam     External Exam       Right Left   External Normal Normal         Slit Lamp Exam       Right Left   Lids/Lashes Normal Normal   Conjunctiva/Sclera mild melanosis mild melanosis   Cornea Clear Clear   Anterior Chamber Deep and clear Deep and clear   Iris Round and Dilated Round and Dilated   Lens Clear Clear   Anterior Vitreous mild syneresis, no pigment mild syneresis, no pigment         Fundus Exam       Right Left   Disc Pink and Sharp Pink and Sharp   C/D Ratio 0.3 0.3   Macula Flat, Good foveal reflex, no heme or edema Flat, Good foveal reflex, no heme or edema   Vessels mild tortuosity, Copper wiring mild  tortuosity, Copper wiring   Periphery Pigmented lattice inferiorly, bullous schisis cavity from 0730-1030 w/ large outer retinal tear along inferior segment- good laser changes surrounding from 0600-1030, no new RT/RD Attached, pigmented lattice w/ atrophic holes 9569-9269 ?shallow schisis IT periphery, no RD           IMAGING AND PROCEDURES  Imaging and Procedures for 10/11/2024  OCT, Retina - OU - Both Eyes       Right Eye Quality was good. Central Foveal Thickness: 260. Progression has been stable. Findings include normal foveal contour, no IRF, no SRF, subretinal fluid, vitreomacular adhesion (Schisis cavity with outer retinal hole, focal RD IT periphery caught on widefield-- not imaged today).   Left Eye Quality was good. Central Foveal Thickness: 255. Progression has been stable. Findings include normal foveal contour, no IRF, no SRF, vitreomacular adhesion .   Notes *Images captured and stored on drive  Diagnosis / Impression:  OD: Schisis cavity w/ outer retinal tear and focal RD IT periphery -- caught on widefield- not imaged today  OS: NFP, no IRF/SRF.   Clinical management:  See below  Abbreviations: NFP - Normal foveal profile. CME - cystoid macular edema. PED - pigment epithelial detachment. IRF - intraretinal fluid. SRF - subretinal fluid. EZ - ellipsoid zone. ERM - epiretinal membrane. ORA - outer retinal atrophy. ORT - outer retinal tubulation. SRHM - subretinal hyper-reflective material. IRHM - intraretinal hyper-reflective material            ASSESSMENT/PLAN:   ICD-10-CM   1. Right retinoschisis  H33.101 OCT, Retina - OU - Both Eyes    2. Right retinal detachment  H33.21     3. Bilateral retinal lattice degeneration  H35.413     4. Retinal hole of both eyes  H33.323     5. Essential hypertension  I10     6. Hypertensive retinopathy of both eyes  H35.033      1,2. Retinoschisis w/ outer retinal tear and focal retinal detachment OD  - Pt presented  with new-onset of floaters, no FOL-pt was seen for annual eye exam by Dr. Erika, telehealth.  - original exam OD showed bullous schisis cavity from 0730-1030  w/ large outer retinal tear along inferior segment - BCVA OU: 20/20, pin holed from 20/30 - s/p retinopexy OD 08.20.25 - good laser surrounding, no new RT/RD  3,4. Lattice degeneration w/ atrophic holes, OU  - Exam OD shows pigmented lattice inferiorly, bullous schisis cavity from 0730-1030 w/ large outer retinal tear along inferior segment, OS pigmented lattice w/ atrophic holes 0430-0730, ?shallow schisis IT periphery  - s/p retinopexy OD 08.20.25 - good laser changes in place OD - recommend retinopexy OS -- pt wishes to defer laser treatment for now - f/u within 7-8 weeks DFE, OCT,   5,6. Hypertensive retinopathy OU - discussed importance of tight BP control - monitor    Ophthalmic Meds Ordered this visit:  No orders of the defined types were placed in this encounter.    Return in about 8 weeks (around 12/06/2024) for f/u, RT, DFE, OCT.  There are no Patient Instructions on file for this visit.  Explained the diagnoses, plan, and follow up with the patient and they expressed understanding.  Patient expressed understanding of the importance of proper follow up care.    This document serves as a record of services personally performed by Redell JUDITHANN Hans, MD, PhD. It was created on their behalf by Avelina Pereyra, COA an ophthalmic technician. The creation of this record is the provider's dictation and/or activities during the visit.   Electronically signed by: Avelina GORMAN Pereyra, COT  10/15/24  10:08 PM   This document serves as a record of services personally performed by Redell JUDITHANN Hans, MD, PhD. It was created on their behalf by Wanda GEANNIE Keens, COT an ophthalmic technician. The creation of this record is the provider's dictation and/or activities during the visit.    Electronically signed by:  Wanda GEANNIE Keens, COT   10/15/24 10:08 PM   Redell JUDITHANN Hans, M.D., Ph.D. Diseases & Surgery of the Retina and Vitreous [facility] 10/11/2024  I have reviewed the above documentation for accuracy and completeness, and I agree with the above. Redell JUDITHANN Hans, M.D., Ph.D. 10/15/24 10:13 PM   Abbreviations: M myopia (nearsighted); A astigmatism; H hyperopia (farsighted); P presbyopia; Mrx spectacle prescription;  CTL contact lenses; OD right eye; OS left eye; OU both eyes  XT exotropia; ET esotropia; PEK punctate epithelial keratitis; PEE punctate epithelial erosions; DES dry eye syndrome; MGD meibomian gland dysfunction; ATs artificial tears; PFAT's preservative free artificial tears; NSC nuclear sclerotic cataract; PSC posterior subcapsular cataract; ERM epi-retinal membrane; PVD posterior vitreous detachment; RD retinal detachment; DM diabetes mellitus; DR diabetic retinopathy; NPDR non-proliferative diabetic retinopathy; PDR proliferative diabetic retinopathy; CSME clinically significant macular edema; DME diabetic macular edema; dbh dot blot hemorrhages; CWS cotton wool spot; POAG primary open angle glaucoma; C/D cup-to-disc ratio; HVF humphrey visual field; GVF goldmann visual field; OCT optical coherence tomography; IOP intraocular pressure; BRVO Branch retinal vein occlusion; CRVO central retinal vein occlusion; CRAO central retinal artery occlusion; BRAO branch retinal artery occlusion; RT retinal tear; SB scleral buckle; PPV pars plana vitrectomy; VH Vitreous hemorrhage; PRP panretinal laser photocoagulation; IVK intravitreal kenalog; VMT vitreomacular traction; MH Macular hole;  NVD neovascularization of the disc; NVE neovascularization elsewhere; AREDS age related eye disease study; ARMD age related macular degeneration; POAG primary open angle glaucoma; EBMD epithelial/anterior basement membrane dystrophy; ACIOL anterior chamber intraocular lens; IOL intraocular lens; PCIOL posterior chamber  intraocular lens; Phaco/IOL phacoemulsification with intraocular lens placement; PRK photorefractive keratectomy; LASIK laser assisted in situ keratomileusis; HTN hypertension; DM diabetes mellitus; COPD chronic  obstructive pulmonary disease

## 2024-10-08 ENCOUNTER — Encounter (INDEPENDENT_AMBULATORY_CARE_PROVIDER_SITE_OTHER): Admitting: Ophthalmology

## 2024-10-11 ENCOUNTER — Ambulatory Visit (INDEPENDENT_AMBULATORY_CARE_PROVIDER_SITE_OTHER): Admitting: Ophthalmology

## 2024-10-11 ENCOUNTER — Encounter (INDEPENDENT_AMBULATORY_CARE_PROVIDER_SITE_OTHER): Payer: Self-pay | Admitting: Ophthalmology

## 2024-10-11 DIAGNOSIS — H3321 Serous retinal detachment, right eye: Secondary | ICD-10-CM | POA: Diagnosis not present

## 2024-10-11 DIAGNOSIS — H35413 Lattice degeneration of retina, bilateral: Secondary | ICD-10-CM

## 2024-10-11 DIAGNOSIS — I10 Essential (primary) hypertension: Secondary | ICD-10-CM | POA: Diagnosis not present

## 2024-10-11 DIAGNOSIS — H35033 Hypertensive retinopathy, bilateral: Secondary | ICD-10-CM | POA: Diagnosis not present

## 2024-10-11 DIAGNOSIS — H33101 Unspecified retinoschisis, right eye: Secondary | ICD-10-CM | POA: Diagnosis not present

## 2024-10-11 DIAGNOSIS — H33323 Round hole, bilateral: Secondary | ICD-10-CM | POA: Diagnosis not present

## 2024-10-15 ENCOUNTER — Encounter (INDEPENDENT_AMBULATORY_CARE_PROVIDER_SITE_OTHER): Payer: Self-pay | Admitting: Ophthalmology

## 2024-11-22 NOTE — Progress Notes (Signed)
 [facility] - Clinic Note  12/06/2024   CHIEF COMPLAINT Patient presents for Retina Follow Up  HISTORY OF PRESENT ILLNESS: Jennifer Wyatt is a 48 y.o. female who presents to the clinic today for:  HPI     Retina Follow Up   In right eye.  This started 8 weeks ago.  Duration of 8 weeks.  Since onset it is stable.        Comments   8 week retina follow up RT OD pt is reporting some floaters denies any flashes she states vision about the same as last visit       Last edited by Resa Delon ORN, COT on 12/06/2024  8:56 AM.     Patient feels the vision is the same.  Referring physician: No referring provider defined for this encounter.  HISTORICAL INFORMATION:  Selected notes from the MEDICAL RECORD NUMBER Referred by Dr. Erika ([facility]) LEE:  Ocular Hx- PMH-   CURRENT MEDICATIONS: Current Outpatient Medications (Ophthalmic Drugs)  Medication Sig   prednisoLONE  acetate (PRED FORTE ) 1 % ophthalmic suspension Place 1 drop into the right eye 4 (four) times daily.   No current facility-administered medications for this visit. (Ophthalmic Drugs)   Current Outpatient Medications (Other)  Medication Sig   AMLODIPINE BENZOATE PO Take 2.5 mg by mouth daily.   lisinopril -hydrochlorothiazide  (ZESTORETIC ) 20-25 MG tablet TAKE 1 TABLET BY MOUTH DAILY   medroxyPROGESTERone  (DEPO-PROVERA ) 150 MG/ML injection INJECT 1 ML IN THE MUSCLE EVERY 12 WEEKS IN OFFICE   VITAMIN D PO Take 1 tablet by mouth daily. For 8 days 1 x weekly 50,000 units   No current facility-administered medications for this visit. (Other)   REVIEW OF SYSTEMS: ROS   Positive for: Cardiovascular, Eyes Last edited by Resa Delon ORN, COT on 12/06/2024  8:56 AM.        ALLERGIES No Known Allergies PAST MEDICAL HISTORY Past Medical History:  Diagnosis Date   Hypertension    Syncope    Varicosities    History reviewed. No pertinent surgical history. FAMILY  HISTORY Family History  Problem Relation Age of Onset   Thyroid  disease Mother    Asthma Father    Hypertension Father    Diabetes Maternal Aunt    Kidney failure Maternal Aunt    SOCIAL HISTORY Social History   Tobacco Use   Smoking status: Never   Smokeless tobacco: Never  Vaping Use   Vaping status: Never Used  Substance Use Topics   Alcohol use: Yes    Comment: occ.   Drug use: No       OPHTHALMIC EXAM:  Base Eye Exam     Visual Acuity (Snellen - Linear)       Right Left   Dist cc 20/25 20/20   Dist ph cc 20/20 -2          Tonometry (Tonopen, 9:00 AM)       Right Left   Pressure 18 16         Pupils       Pupils Dark Light Shape React APD   Right PERRL 4 2 Round Brisk None   Left PERRL 4 2 Round Brisk None         Visual Fields       Left Right    Full Full         Extraocular Movement       Right Left    Full, Ortho Full, Ortho  Neuro/Psych     Oriented x3: Yes   Mood/Affect: Normal         Dilation     Both eyes: 2.5% Phenylephrine @ 9:00 AM           Slit Lamp and Fundus Exam     External Exam       Right Left   External Normal Normal         Slit Lamp Exam       Right Left   Lids/Lashes Normal Normal   Conjunctiva/Sclera mild melanosis mild melanosis   Cornea Clear Clear   Anterior Chamber Deep and clear Deep and clear   Iris Round and Dilated Round and Dilated   Lens Clear Clear   Anterior Vitreous mild syneresis, no pigment mild syneresis, no pigment         Fundus Exam       Right Left   Disc Pink and Sharp Pink and Sharp   C/D Ratio 0.3 0.3   Macula Flat, Good foveal reflex, no heme or edema Flat, Good foveal reflex, no heme or edema   Vessels mild tortuosity, Copper wiring mild tortuosity, Copper wiring   Periphery Pigmented lattice inferiorly, bullous schisis cavity from 0730-1030 w/ large outer retinal tear along inferior segment- good laser changes surrounding from 0600-1030,  no new RT/RD/lattice/schisis Attached, pigmented lattice w/ atrophic holes [id number] ?shallow schisis IT periphery, no RD           Refraction     Wearing Rx       Sphere Cylinder Axis Add   Right -2.00 +1.25 149 +2.00   Left -1.75 +1.50 031 +2.00           IMAGING AND PROCEDURES  Imaging and Procedures for 12/06/2024  OCT, Retina - OU - Both Eyes       Right Eye Quality was good. Central Foveal Thickness: 252. Progression has been stable. Findings include normal foveal contour, no IRF, no SRF, subretinal fluid, vitreomacular adhesion (Schisis cavity with outer retinal hole, focal RD IT periphery caught on widefield-- not imaged today).   Left Eye Quality was good. Central Foveal Thickness: 250. Progression has been stable. Findings include normal foveal contour, no IRF, no SRF, vitreomacular adhesion .   Notes *Images captured and stored on drive  Diagnosis / Impression:  OD: Schisis cavity w/ outer retinal tear and focal RD IT periphery -- caught on widefield- not imaged today  OS: NFP, no IRF/SRF.   Clinical management:  See below  Abbreviations: NFP - Normal foveal profile. CME - cystoid macular edema. PED - pigment epithelial detachment. IRF - intraretinal fluid. SRF - subretinal fluid. EZ - ellipsoid zone. ERM - epiretinal membrane. ORA - outer retinal atrophy. ORT - outer retinal tubulation. SRHM - subretinal hyper-reflective material. IRHM - intraretinal hyper-reflective material             ASSESSMENT/PLAN:   ICD-10-CM   1. Right retinoschisis  H33.101 OCT, Retina - OU - Both Eyes    2. Right retinal detachment  H33.21     3. Bilateral retinal lattice degeneration  H35.413     4. Retinal hole of both eyes  H33.323     5. Essential hypertension  I10     6. Hypertensive retinopathy of both eyes  H35.033      1,2. Retinoschisis w/ outer retinal tear and focal retinal detachment OD  - Pt presented with new-onset of floaters, no FOL-pt was seen for  annual eye exam  by Dr. Erika, telehealth.  - original exam OD showed bullous schisis cavity from 0730-1030 w/ large outer retinal tear along inferior segment - BCVA OU: 20/20, pin holed from 20/30 - s/p retinopexy OD 08.20.25 - good laser surrounding, no new RT/RD  3,4. Lattice degeneration w/ atrophic holes, OU  - Exam OD shows pigmented lattice inferiorly, bullous schisis cavity from 0730-1030 w/ large outer retinal tear along inferior segment, OS pigmented lattice w/ atrophic holes 0430-0730, ?shallow schisis IT periphery  - s/p retinopexy OD 08.20.25 - good laser changes in place OD - recommend retinopexy OS -- pt wishes to defer laser treatment for now - f/u within 4-6 months DFE, OCT   5,6. Hypertensive retinopathy OU - discussed importance of tight BP control - monitor    Ophthalmic Meds Ordered this visit:  No orders of the defined types were placed in this encounter.    Return in about 6 months (around 06/06/2025) for f/u, RD, DFE, OCT.  There are no Patient Instructions on file for this visit.  Explained the diagnoses, plan, and follow up with the patient and they expressed understanding.  Patient expressed understanding of the importance of proper follow up care.    This document serves as a record of services personally performed by Redell JUDITHANN Hans, MD, PhD. It was created on their behalf by Avelina Pereyra, COA an ophthalmic technician. The creation of this record is the provider's dictation and/or activities during the visit.   Electronically signed by: Avelina GORMAN Pereyra, COT  12/06/24  11:11 AM   This document serves as a record of services personally performed by Redell JUDITHANN Hans, MD, PhD. It was created on their behalf by Wanda GEANNIE Keens, COT an ophthalmic technician. The creation of this record is the provider's dictation and/or activities during the visit.    Electronically signed by:  Wanda GEANNIE Keens, COT  12/06/24 11:11 AM   Redell JUDITHANN Hans, M.D.,  Ph.D. Diseases & Surgery of the Retina and Vitreous [facility] 12/06/2024   Abbreviations: M myopia (nearsighted); A astigmatism; H hyperopia (farsighted); P presbyopia; Mrx spectacle prescription;  CTL contact lenses; OD right eye; OS left eye; OU both eyes  XT exotropia; ET esotropia; PEK punctate epithelial keratitis; PEE punctate epithelial erosions; DES dry eye syndrome; MGD meibomian gland dysfunction; ATs artificial tears; PFAT's preservative free artificial tears; NSC nuclear sclerotic cataract; PSC posterior subcapsular cataract; ERM epi-retinal membrane; PVD posterior vitreous detachment; RD retinal detachment; DM diabetes mellitus; DR diabetic retinopathy; NPDR non-proliferative diabetic retinopathy; PDR proliferative diabetic retinopathy; CSME clinically significant macular edema; DME diabetic macular edema; dbh dot blot hemorrhages; CWS cotton wool spot; POAG primary open angle glaucoma; C/D cup-to-disc ratio; HVF humphrey visual field; GVF goldmann visual field; OCT optical coherence tomography; IOP intraocular pressure; BRVO Branch retinal vein occlusion; CRVO central retinal vein occlusion; CRAO central retinal artery occlusion; BRAO branch retinal artery occlusion; RT retinal tear; SB scleral buckle; PPV pars plana vitrectomy; VH Vitreous hemorrhage; PRP panretinal laser photocoagulation; IVK intravitreal kenalog; VMT vitreomacular traction; MH Macular hole;  NVD neovascularization of the disc; NVE neovascularization elsewhere; AREDS age related eye disease study; ARMD age related macular degeneration; POAG primary open angle glaucoma; EBMD epithelial/anterior basement membrane dystrophy; ACIOL anterior chamber intraocular lens; IOL intraocular lens; PCIOL posterior chamber intraocular lens; Phaco/IOL phacoemulsification with intraocular lens placement; PRK photorefractive keratectomy; LASIK laser assisted in situ keratomileusis; HTN hypertension; DM diabetes mellitus;  COPD chronic obstructive pulmonary disease

## 2024-11-30 ENCOUNTER — Ambulatory Visit

## 2024-11-30 DIAGNOSIS — Z3042 Encounter for surveillance of injectable contraceptive: Secondary | ICD-10-CM | POA: Diagnosis not present

## 2024-11-30 MED ORDER — MEDROXYPROGESTERONE ACETATE 150 MG/ML IM SUSP
150.0000 mg | Freq: Once | INTRAMUSCULAR | Status: AC
  Administered 2024-11-30: 150 mg via INTRAMUSCULAR

## 2024-11-30 NOTE — Progress Notes (Signed)
   NURSE VISIT- INJECTION  SUBJECTIVE:  Jennifer Wyatt is a 48 y.o. [id number] female here for a Depo Provera  for contraception/period management. She is a GYN patient.   OBJECTIVE:  There were no vitals taken for this visit.  Appears well, in no apparent distress  Injection administered in: Right upper quad. gluteus  Meds ordered this encounter  Medications   medroxyPROGESTERone  (DEPO-PROVERA ) injection 150 mg    ASSESSMENT: GYN patient Depo Provera  for contraception/period management PLAN: Follow-up: in 11-13 weeks for next Depo   Jennifer Wyatt  11/30/2024 9:35 AM

## 2024-12-06 ENCOUNTER — Encounter (INDEPENDENT_AMBULATORY_CARE_PROVIDER_SITE_OTHER): Payer: Self-pay | Admitting: Ophthalmology

## 2024-12-06 ENCOUNTER — Ambulatory Visit (INDEPENDENT_AMBULATORY_CARE_PROVIDER_SITE_OTHER): Admitting: Ophthalmology

## 2024-12-06 DIAGNOSIS — H35033 Hypertensive retinopathy, bilateral: Secondary | ICD-10-CM | POA: Diagnosis not present

## 2024-12-06 DIAGNOSIS — I10 Essential (primary) hypertension: Secondary | ICD-10-CM | POA: Diagnosis not present

## 2024-12-06 DIAGNOSIS — H3321 Serous retinal detachment, right eye: Secondary | ICD-10-CM | POA: Diagnosis not present

## 2024-12-06 DIAGNOSIS — H33323 Round hole, bilateral: Secondary | ICD-10-CM | POA: Diagnosis not present

## 2024-12-06 DIAGNOSIS — H33101 Unspecified retinoschisis, right eye: Secondary | ICD-10-CM

## 2024-12-06 DIAGNOSIS — H35413 Lattice degeneration of retina, bilateral: Secondary | ICD-10-CM

## 2024-12-06 MED ORDER — PREDNISOLONE ACETATE 1 % OP SUSP: 1.0000 [drp] | Freq: Four times a day (QID) | OPHTHALMIC | 0 refills | Status: AC

## 2025-01-04 NOTE — Progress Notes (Signed)
 " [facility] - Clinic Note  01/17/2025   CHIEF COMPLAINT Patient presents for Retina Follow Up  HISTORY OF PRESENT ILLNESS: Jennifer Wyatt is a 49 y.o. female who presents to the clinic today for:  HPI     Retina Follow Up   In right eye.  Severity is moderate.  Duration of 6 weeks.  Since onset it is stable.  I, the attending physician,  performed the HPI with the patient and updated documentation appropriately.        Comments   6 week Retina eval. Patient states no changes noticed      Last edited by Valdemar Rogue, MD on 01/24/2025  3:35 PM.     Patient feels the vision is the same. No new flashes or floaters.  Referring physician: No referring provider defined for this encounter.  HISTORICAL INFORMATION:  Selected notes from the MEDICAL RECORD NUMBER Referred by Dr. Erika ([facility]) LEE:  Ocular Hx- PMH-   CURRENT MEDICATIONS: Current Outpatient Medications (Ophthalmic Drugs)  Medication Sig   prednisoLONE  acetate (PRED FORTE ) 1 % ophthalmic suspension Place 1 drop into the right eye 4 (four) times daily.   No current facility-administered medications for this visit. (Ophthalmic Drugs)   Current Outpatient Medications (Other)  Medication Sig   AMLODIPINE BENZOATE PO Take 2.5 mg by mouth daily.   lisinopril -hydrochlorothiazide  (ZESTORETIC ) 20-25 MG tablet TAKE 1 TABLET BY MOUTH DAILY   medroxyPROGESTERone  (DEPO-PROVERA ) 150 MG/ML injection INJECT 1 ML IN THE MUSCLE EVERY 12 WEEKS IN OFFICE   VITAMIN D PO Take 1 tablet by mouth daily. For 8 days 1 x weekly 50,000 units   No current facility-administered medications for this visit. (Other)   REVIEW OF SYSTEMS: ROS   Positive for: Cardiovascular, Eyes Last edited by German Olam BRAVO, COT on 01/17/2025  8:24 AM.      ALLERGIES No Known Allergies  PAST MEDICAL HISTORY Past Medical History:  Diagnosis Date   Hypertension    Syncope    Varicosities    History reviewed.  No pertinent surgical history. FAMILY HISTORY Family History  Problem Relation Age of Onset   Thyroid  disease Mother    Asthma Father    Hypertension Father    Diabetes Maternal Aunt    Kidney failure Maternal Aunt    SOCIAL HISTORY Social History   Tobacco Use   Smoking status: Never   Smokeless tobacco: Never  Vaping Use   Vaping status: Never Used  Substance Use Topics   Alcohol use: Yes    Comment: occ.   Drug use: No       OPHTHALMIC EXAM:  Base Eye Exam     Visual Acuity (Snellen - Linear)       Right Left   Dist cc 20/25 - 20/20 -1   Dist ph cc 20/20 -1     Correction: Glasses         Tonometry (Tonopen, 8:30 AM)       Right Left   Pressure 15` 16         Pupils       Dark Light Shape React APD   Right 3 2 Round Brisk None   Left 3 2 Round Brisk None         Visual Fields       Left Right    Full Full         Extraocular Movement       Right Left  Full, Ortho Full, Ortho         Neuro/Psych     Oriented x3: Yes   Mood/Affect: Normal         Dilation     Both eyes: 1.0% Mydriacyl, 2.5% Phenylephrine @ 8:30 AM           Slit Lamp and Fundus Exam     External Exam       Right Left   External Normal Normal         Slit Lamp Exam       Right Left   Lids/Lashes Normal Normal   Conjunctiva/Sclera mild melanosis mild melanosis   Cornea Clear Clear   Anterior Chamber Deep and clear Deep and clear   Iris Round and Dilated Round and Dilated   Lens Clear Clear   Anterior Vitreous mild syneresis, no pigment mild syneresis, no pigment         Fundus Exam       Right Left   Disc Pink and Sharp Pink and Sharp   C/D Ratio 0.3 0.3   Macula Flat, Good foveal reflex, no heme or edema Flat, Good foveal reflex, no heme or edema   Vessels mild tortuosity, Copper wiring mild tortuosity, Copper wiring   Periphery Pigmented lattice inferiorly, bullous schisis cavity from 0730-1030 w/ large outer retinal tear along  inferior segment- good laser changes surrounding from 0600-1030, no new RT/RD/lattice/schisis Attached, pigmented lattice w/ atrophic holes 0430-0730, good laser surrounding from 0330-0730, ?shallow schisis IT periphery, no RT/RD/lattice/schisis           Refraction     Wearing Rx       Sphere Cylinder Axis Add   Right -2.00 +1.25 149 +2.00   Left -1.75 +1.50 031 +2.00           IMAGING AND PROCEDURES  Imaging and Procedures for 01/17/2025  OCT, Retina - OU - Both Eyes       Right Eye Quality was good. Central Foveal Thickness: 253. Progression has been stable. Findings include normal foveal contour, no IRF, no SRF, subretinal fluid, vitreomacular adhesion (Schisis cavity with outer retinal hole, focal RD IT periphery caught on widefield).   Left Eye Quality was good. Central Foveal Thickness: 256. Progression has been stable. Findings include normal foveal contour, no IRF, no SRF, vitreomacular adhesion .   Notes *Images captured and stored on drive  Diagnosis / Impression:  OD: Schisis cavity w/ outer retinal tear and focal RD IT periphery -- caught on widefield- not imaged today  OS: NFP, no IRF/SRF.   Clinical management:  See below  Abbreviations: NFP - Normal foveal profile. CME - cystoid macular edema. PED - pigment epithelial detachment. IRF - intraretinal fluid. SRF - subretinal fluid. EZ - ellipsoid zone. ERM - epiretinal membrane. ORA - outer retinal atrophy. ORT - outer retinal tubulation. SRHM - subretinal hyper-reflective material. IRHM - intraretinal hyper-reflective material           ASSESSMENT/PLAN:   ICD-10-CM   1. Right retinoschisis  H33.101 OCT, Retina - OU - Both Eyes    2. Right retinal detachment  H33.21 OCT, Retina - OU - Both Eyes    3. Bilateral retinal lattice degeneration  H35.413 OCT, Retina - OU - Both Eyes    4. Retinal hole of both eyes  H33.323 OCT, Retina - OU - Both Eyes    5. Essential hypertension  I10     6.  Hypertensive retinopathy of both eyes  H35.033  1,2. Retinoschisis w/ outer retinal tear and focal retinal detachment OD  - Pt presented with new-onset of floaters, no FOL-pt was seen for annual eye exam by Dr. Erika, telehealth.  - original exam OD showed bullous schisis cavity from 0730-1030 w/ large outer retinal tear along inferior segment - BCVA OU: 20/20, pin holed from 20/30 - s/p retinopexy OD 08.20.25 - good laser in place, no new RT/RD - monitor  3,4. Lattice degeneration w/ atrophic holes, OU  - Exam OD shows pigmented lattice inferiorly, bullous schisis cavity from 0730-1030 w/ large outer retinal tear along inferior segment, OS pigmented lattice w/ atrophic holes 9569-9269 - s/p retinopexy OD 08.20.25 - s/p retinopexy OS 12.08.25 - good laser changes in place OU - no new RT/RD - f/u in 6 months - DFE, OCT   5,6. Hypertensive retinopathy OU - discussed importance of tight BP control - monitor  Ophthalmic Meds Ordered this visit:  No orders of the defined types were placed in this encounter.    Return in about 6 months (around 07/17/2025) for f/u schisis, DFE, OCT.  There are no Patient Instructions on file for this visit.  Explained the diagnoses, plan, and follow up with the patient and they expressed understanding.  Patient expressed understanding of the importance of proper follow up care.   This document serves as a record of services personally performed by Redell JUDITHANN Hans, MD, PhD. It was created on their behalf by Paulina Jamse Gay an ophthalmic technician. The creation of this record is the provider's dictation and/or activities during the visit.   Electronically signed by: Alana D Fowler  01/24/25  3:36 PM   This document serves as a record of services personally performed by Redell JUDITHANN Hans, MD, PhD. It was created on their behalf by Wanda GEANNIE Keens, COT an ophthalmic technician. The creation of this record is the provider's dictation and/or  activities during the visit.    Electronically signed by:  Wanda GEANNIE Keens, COT  01/24/25 3:36 PM  Redell JUDITHANN Hans, M.D., Ph.D. Diseases & Surgery of the Retina and Vitreous [facility] 01/17/2025  I have reviewed the above documentation for accuracy and completeness, and I agree with the above. Redell JUDITHANN Hans, M.D., Ph.D. 01/24/25 3:37 PM   Abbreviations: M myopia (nearsighted); A astigmatism; H hyperopia (farsighted); P presbyopia; Mrx spectacle prescription;  CTL contact lenses; OD right eye; OS left eye; OU both eyes  XT exotropia; ET esotropia; PEK punctate epithelial keratitis; PEE punctate epithelial erosions; DES dry eye syndrome; MGD meibomian gland dysfunction; ATs artificial tears; PFAT's preservative free artificial tears; NSC nuclear sclerotic cataract; PSC posterior subcapsular cataract; ERM epi-retinal membrane; PVD posterior vitreous detachment; RD retinal detachment; DM diabetes mellitus; DR diabetic retinopathy; NPDR non-proliferative diabetic retinopathy; PDR proliferative diabetic retinopathy; CSME clinically significant macular edema; DME diabetic macular edema; dbh dot blot hemorrhages; CWS cotton wool spot; POAG primary open angle glaucoma; C/D cup-to-disc ratio; HVF humphrey visual field; GVF goldmann visual field; OCT optical coherence tomography; IOP intraocular pressure; BRVO Branch retinal vein occlusion; CRVO central retinal vein occlusion; CRAO central retinal artery occlusion; BRAO branch retinal artery occlusion; RT retinal tear; SB scleral buckle; PPV pars plana vitrectomy; VH Vitreous hemorrhage; PRP panretinal laser photocoagulation; IVK intravitreal kenalog; VMT vitreomacular traction; MH Macular hole;  NVD neovascularization of the disc; NVE neovascularization elsewhere; AREDS age related eye disease study; ARMD age related macular degeneration; POAG primary open angle glaucoma; EBMD epithelial/anterior basement membrane dystrophy; ACIOL  anterior chamber  intraocular lens; IOL intraocular lens; PCIOL posterior chamber intraocular lens; Phaco/IOL phacoemulsification with intraocular lens placement; PRK photorefractive keratectomy; LASIK laser assisted in situ keratomileusis; HTN hypertension; DM diabetes mellitus; COPD chronic obstructive pulmonary disease  "

## 2025-01-17 ENCOUNTER — Ambulatory Visit (INDEPENDENT_AMBULATORY_CARE_PROVIDER_SITE_OTHER): Admitting: Ophthalmology

## 2025-01-17 ENCOUNTER — Encounter (INDEPENDENT_AMBULATORY_CARE_PROVIDER_SITE_OTHER): Payer: Self-pay | Admitting: Ophthalmology

## 2025-01-17 DIAGNOSIS — H33101 Unspecified retinoschisis, right eye: Secondary | ICD-10-CM

## 2025-01-17 DIAGNOSIS — H3321 Serous retinal detachment, right eye: Secondary | ICD-10-CM | POA: Diagnosis not present

## 2025-01-17 DIAGNOSIS — H35413 Lattice degeneration of retina, bilateral: Secondary | ICD-10-CM

## 2025-01-17 DIAGNOSIS — H35033 Hypertensive retinopathy, bilateral: Secondary | ICD-10-CM

## 2025-01-17 DIAGNOSIS — H33323 Round hole, bilateral: Secondary | ICD-10-CM | POA: Diagnosis not present

## 2025-01-17 DIAGNOSIS — I10 Essential (primary) hypertension: Secondary | ICD-10-CM | POA: Diagnosis not present

## 2025-01-24 ENCOUNTER — Encounter (INDEPENDENT_AMBULATORY_CARE_PROVIDER_SITE_OTHER): Payer: Self-pay | Admitting: Ophthalmology

## 2025-01-25 ENCOUNTER — Other Ambulatory Visit: Payer: Self-pay | Admitting: Adult Health

## 2025-02-22 ENCOUNTER — Ambulatory Visit

## 2025-07-18 ENCOUNTER — Encounter (INDEPENDENT_AMBULATORY_CARE_PROVIDER_SITE_OTHER): Admitting: Ophthalmology
